# Patient Record
Sex: Male | Born: 1945 | Race: White | Hispanic: No | Marital: Married
Health system: Midwestern US, Academic
[De-identification: ages and names within clinical notes are randomized; demographics above are authoritative.]

---

## 2015-02-12 IMAGING — CT Head^_WITHOUT_CONTRAST (Adult)
1 series · 16 of 30 positions shown, 20 images · non-contrast
Comparison: March 28, 2006

CT HEAD WITHOUT CONTRAST
INDICATION: Headache
TECHNIQUE: Multiple standard axial reference plains without contrast
Orthogonal reconstructions

[Series 2: brain w/o 4.8 brain · axial · non-contrast · 0.55mm/px · z∈[+140,+289]mm · 16 of 32 slices shown, 20 images]
[im 2/32  brain]
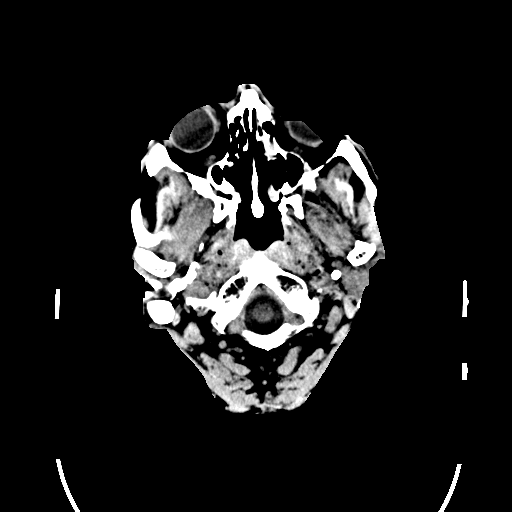
[im 2/32  bone]
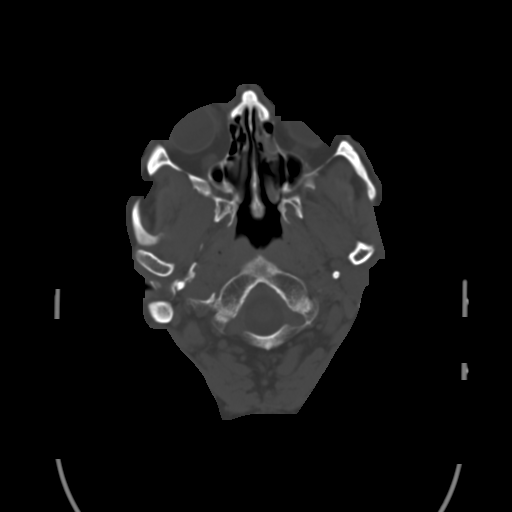
[im 4/32  brain]
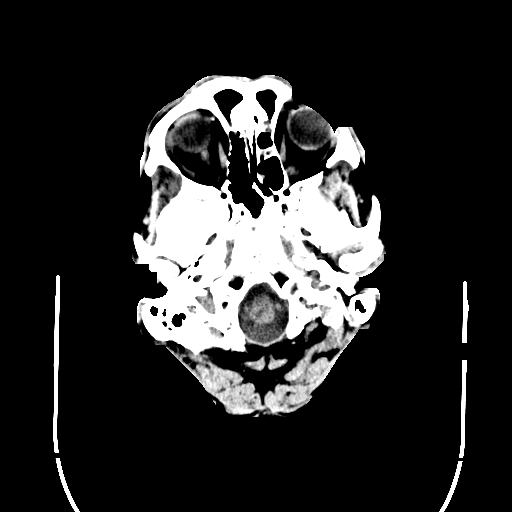
[im 6/32  brain]
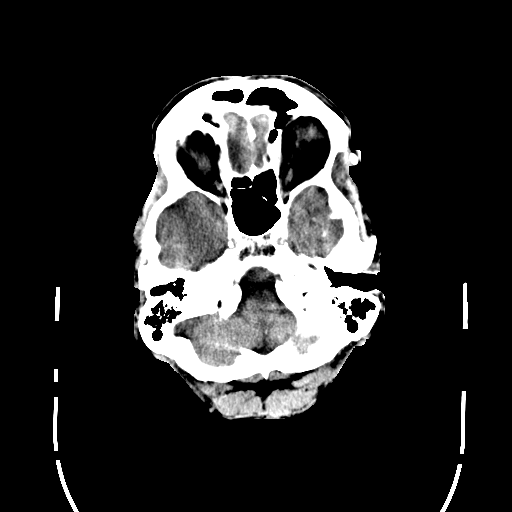
[im 8/32  brain]
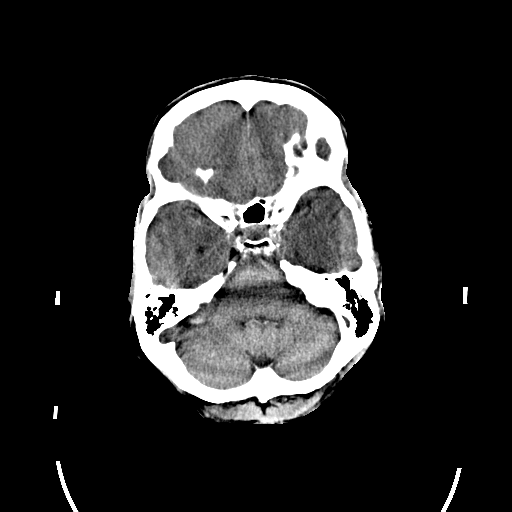
[im 9/32  brain]
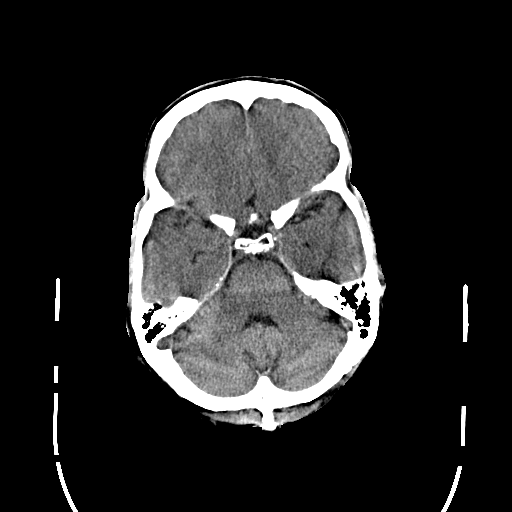
[im 9/32  bone]
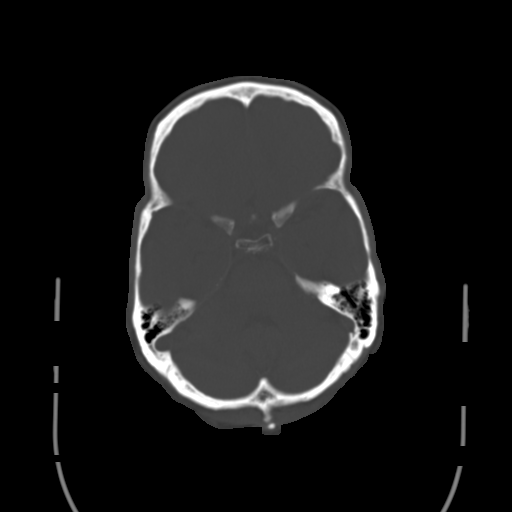
[im 11/32  brain]
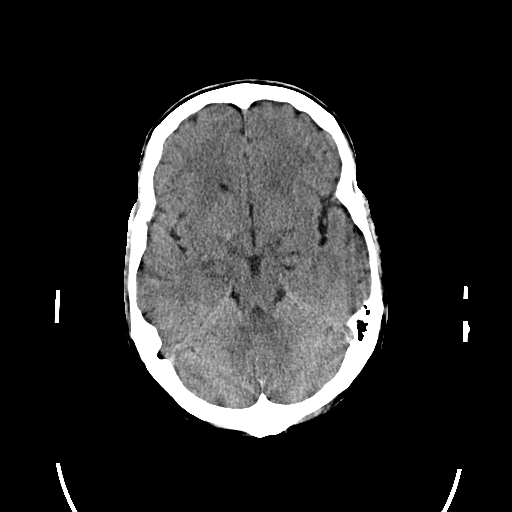
[im 13/32  brain]
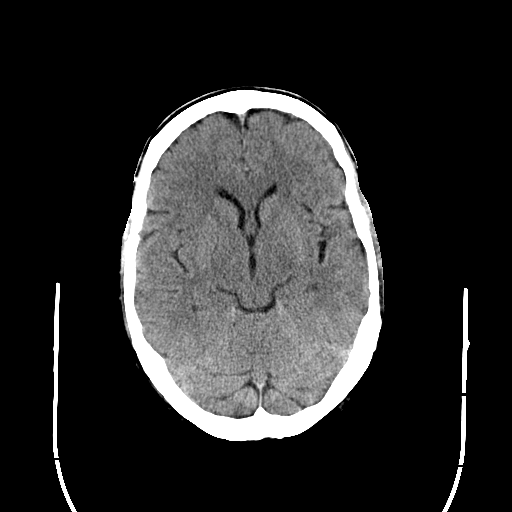
[im 15/32  brain]
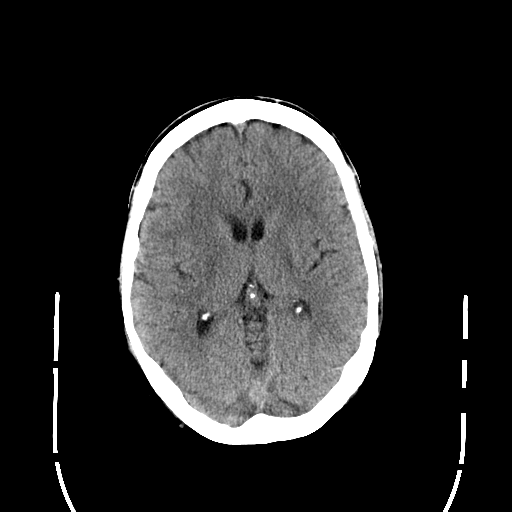
[im 17/32  brain]
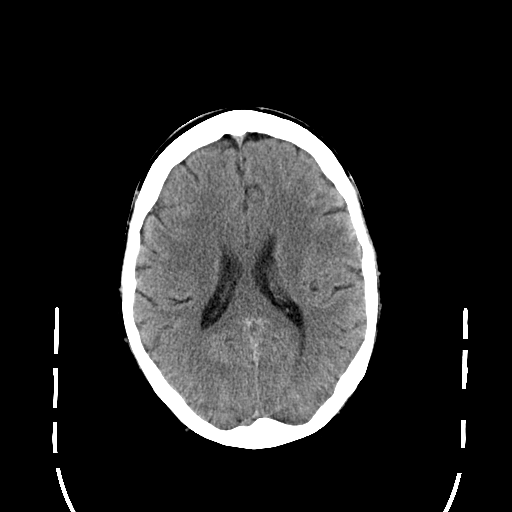
[im 17/32  bone]
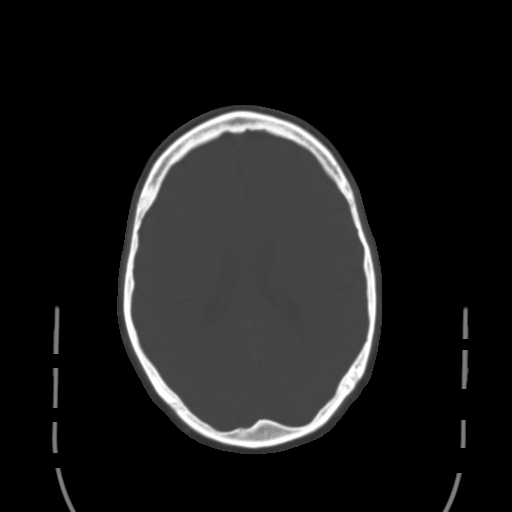
[im 19/32  brain]
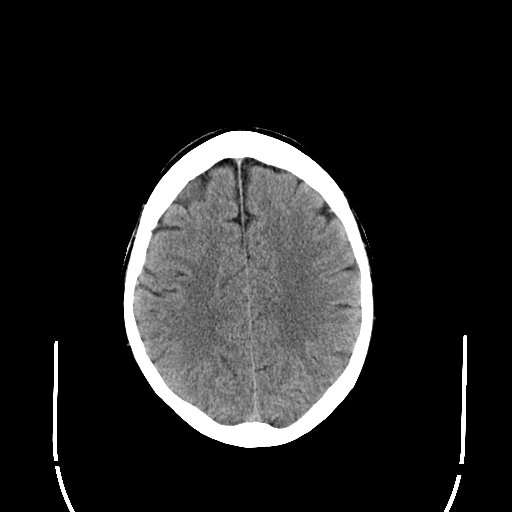
[im 21/32  brain]
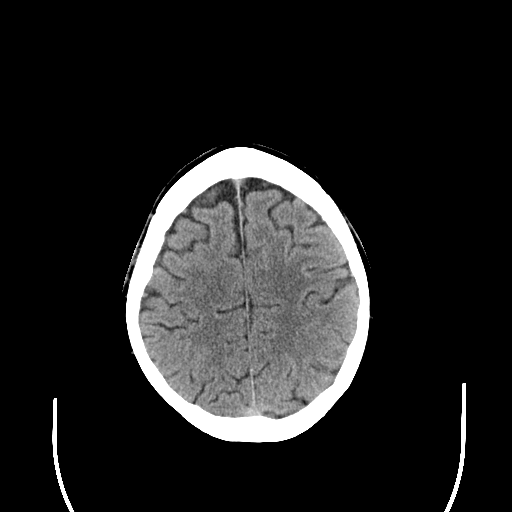
[im 23/32  brain]
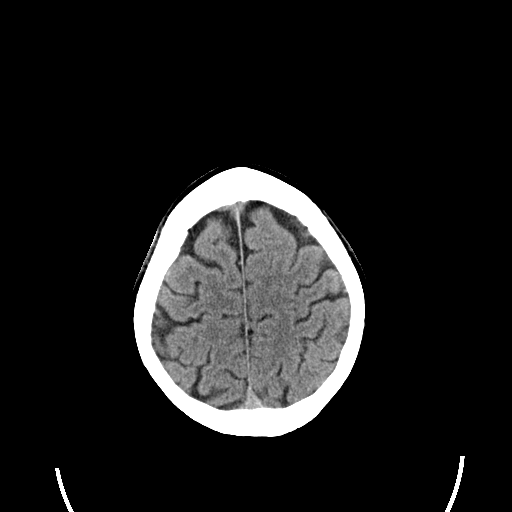
[im 24/32  brain]
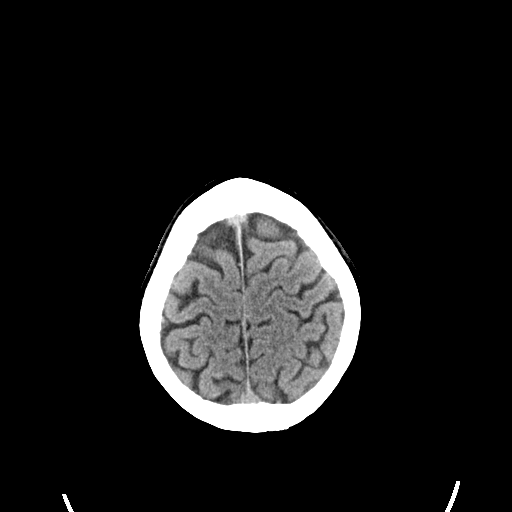
[im 24/32  bone]
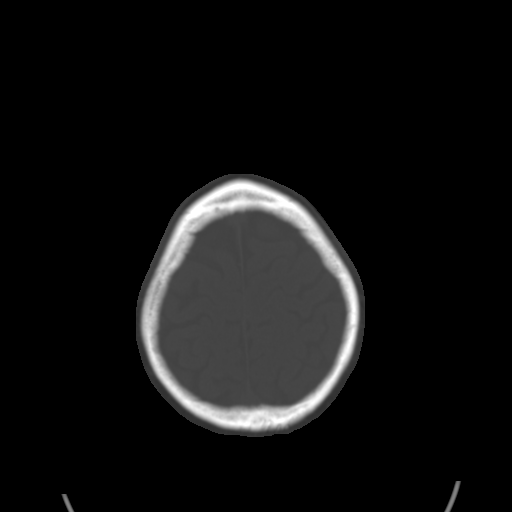
[im 26/32  brain]
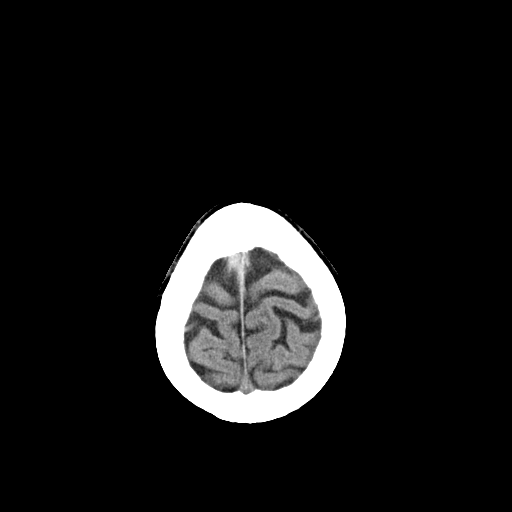
[im 28/32  brain]
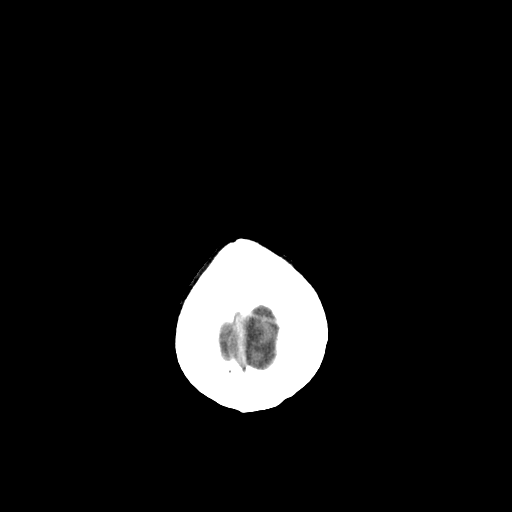
[im 30/32  brain]
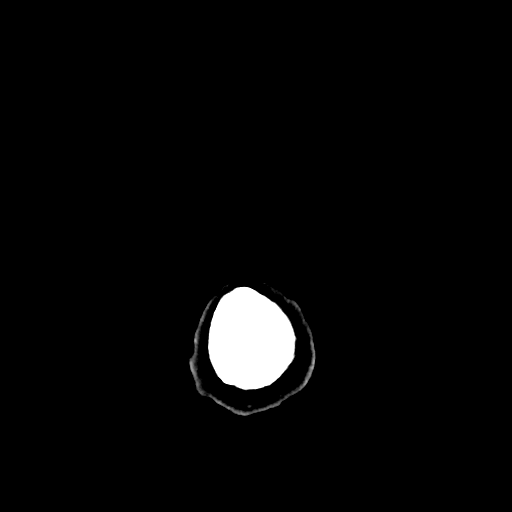

[16 of 30 positions shown; findings below may reference images not displayed]

IMPRESSION: No acute intracranial process
Exam discussed with Og Griswold PA at [DATE] p.m.
FINDINGS: The ventricular system, cortical sulci, sylvian fissures and basilar cisterns
are physiologic in size for the age. .
The is no hemorrhage, mass or focal defect.
The calvarium is symetrical  and intact
The demonstrated paranasal sinuses reveal moderate left ethmoid
mucoperiosteal thickening
The temporal bones reveal satisfactory aeration.

JF

## 2020-11-18 IMAGING — US AAASCREEN
1 series · 14 of 24 positions shown · non-contrast
Comparison: none

[Series 1: us aaa screening · 14 of 24 slices shown]
[im 1/24]
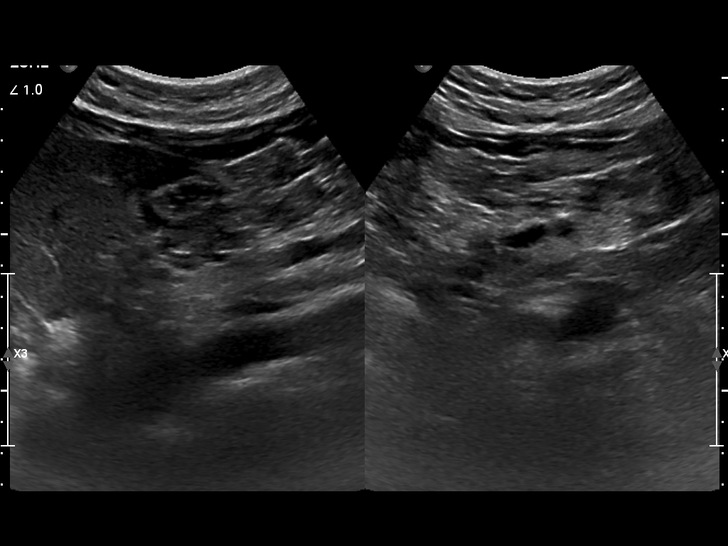
[im 3/24]
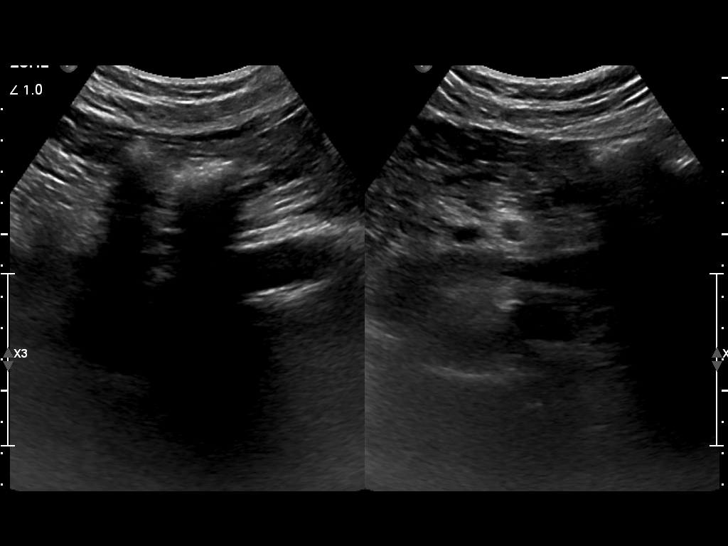
[im 5/24]
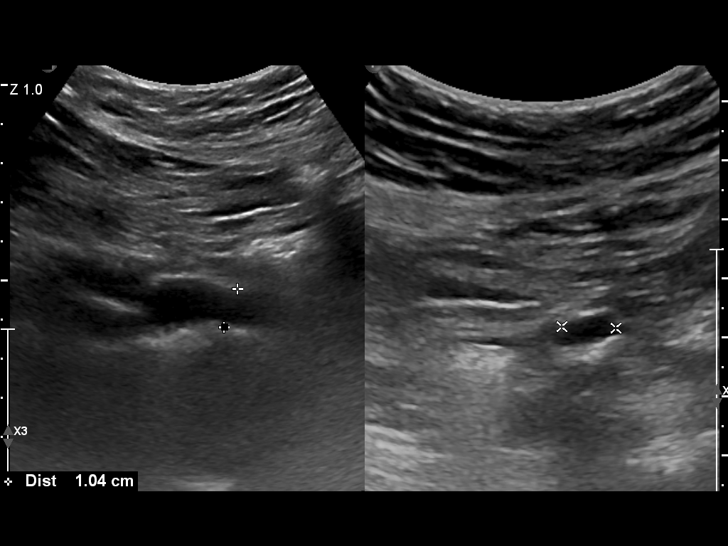
[im 7/24]
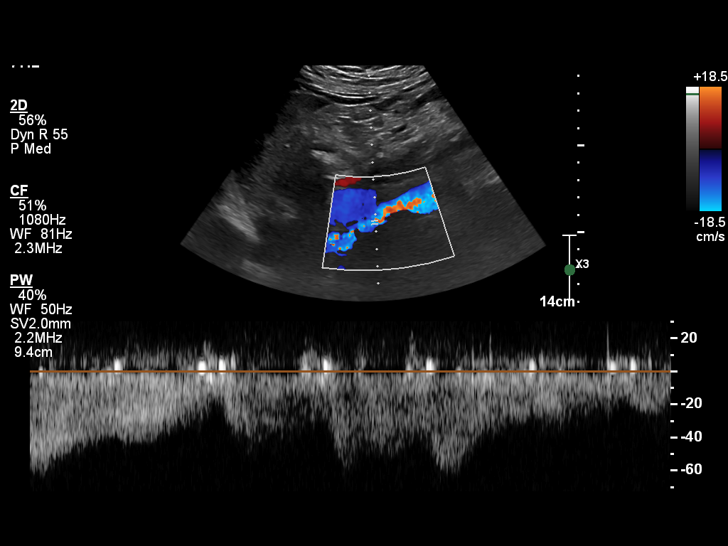
[im 8/24]
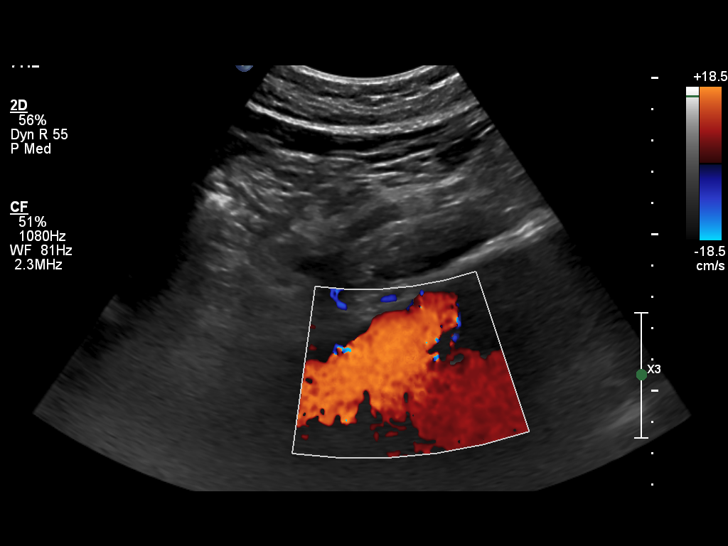
[im 10/24]
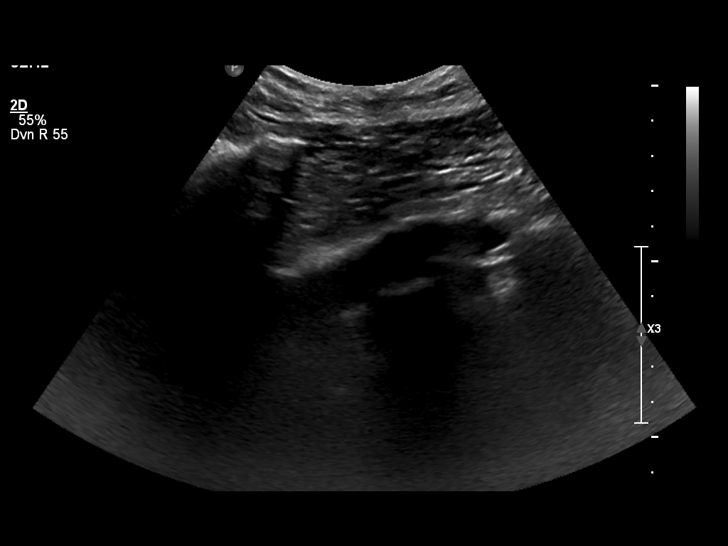
[im 12/24]
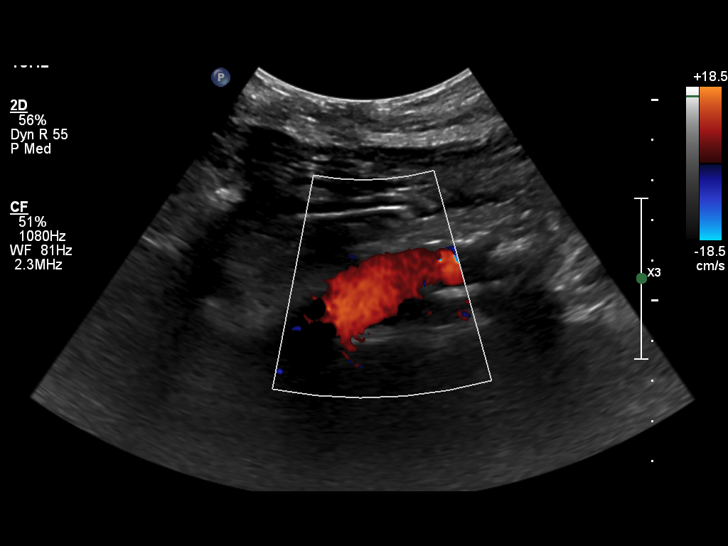
[im 13/24]
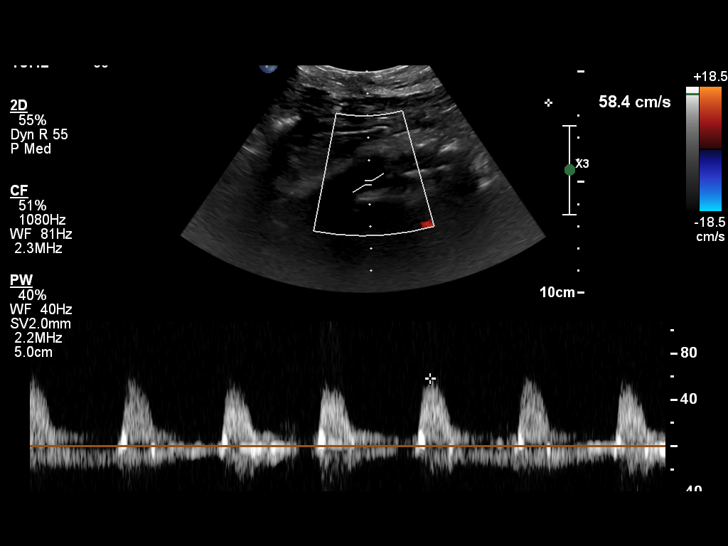
[im 15/24]
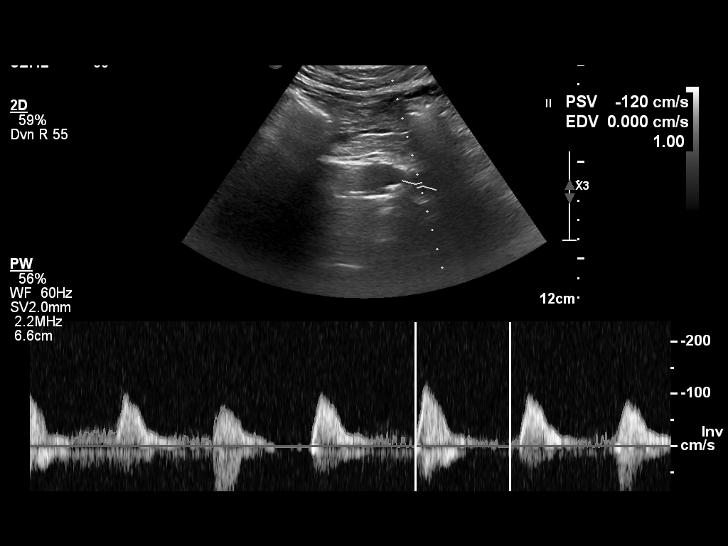
[im 17/24]
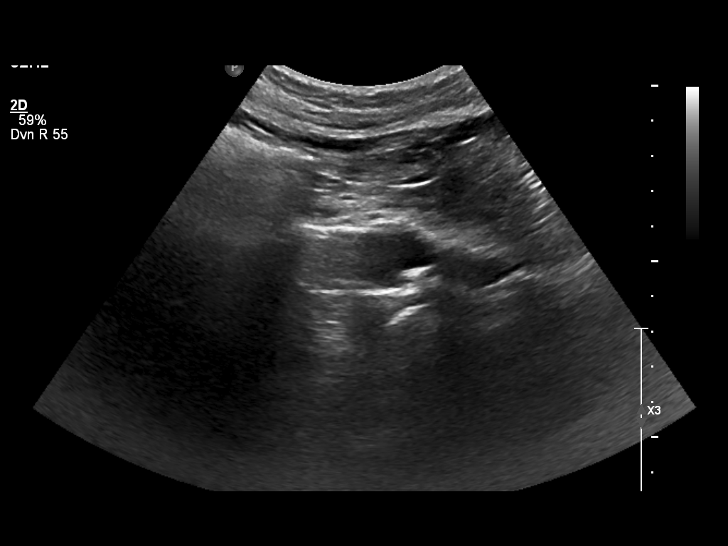
[im 19/24]
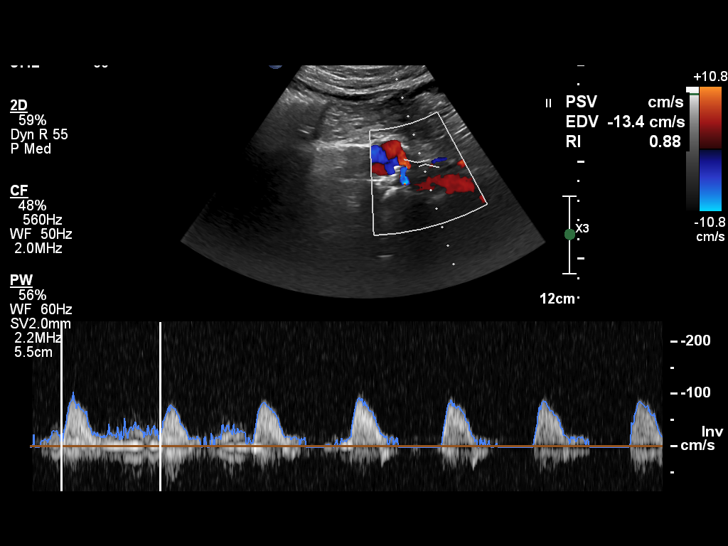
[im 20/24]
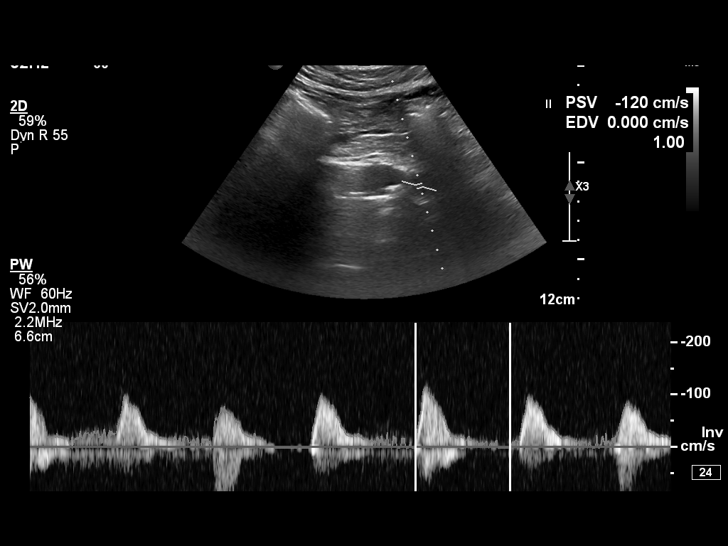
[im 22/24]
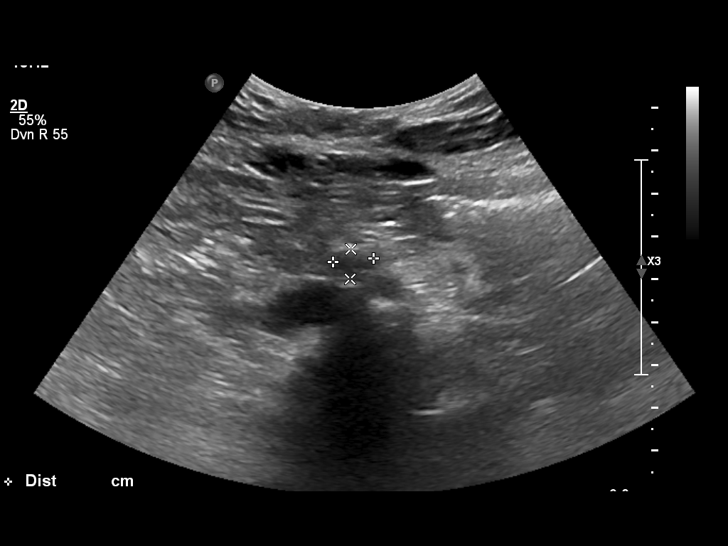
[im 24/24]
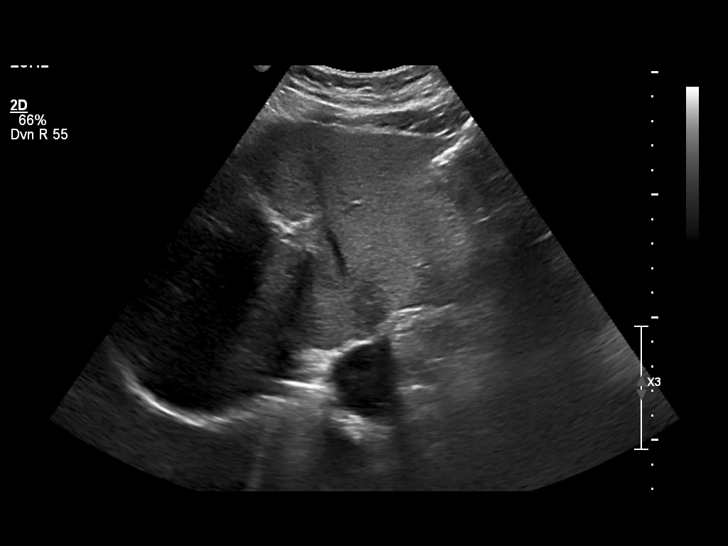

[14 of 24 positions shown; findings below may reference images not displayed]

DIAGNOSTIC STUDIES

EXAM

Abdominal aortic ultrasound

INDICATION

screening for AAA
AAA SCREENING

TECHNIQUE

Standard abdominal aortic ultrasound was obtained.

COMPARISONS

None available

FINDINGS

There is no evidence for aneurysmal enlargement of the abdominal aorta. Scattered plaque formation
is noted. Common iliac arteries are normal in caliber.

IMPRESSION

No evidence for abdominal aortic aneurysm.

Tech Notes:

AAA SCREENING

## 2020-11-18 IMAGING — CT LOW_DOSE_LUNG(Adult)
2 of 9 series · 11 of 36 positions shown, 14 images · non-contrast
Comparison: none

[Series 10: lung cor 1.00 br44 s3 · coronal · 0.73mm/px · 1 of 298 slices shown]
[im 149/298  lung]
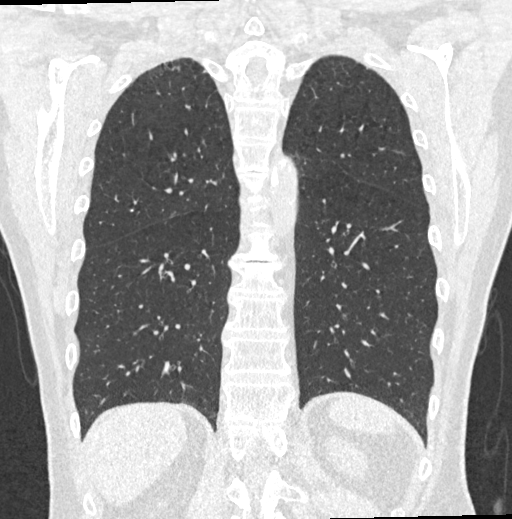

[Series 17: lung 1.00 br60 s3 cad · axial · 0.70mm/px · z∈[+1557,+1865]mm · 10 of 538 slices shown, 13 images]
[im 49/538  mediastinal]
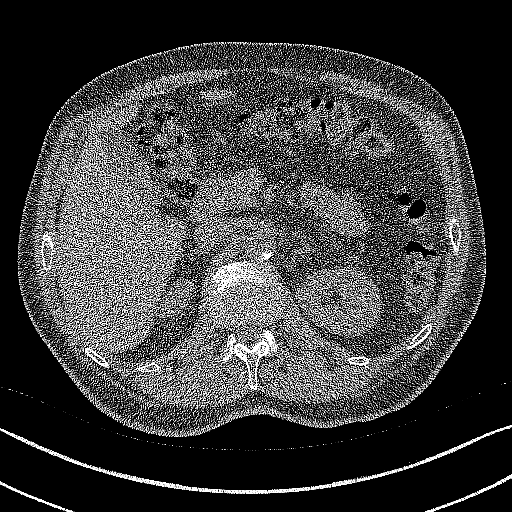
[im 49/538  lung]
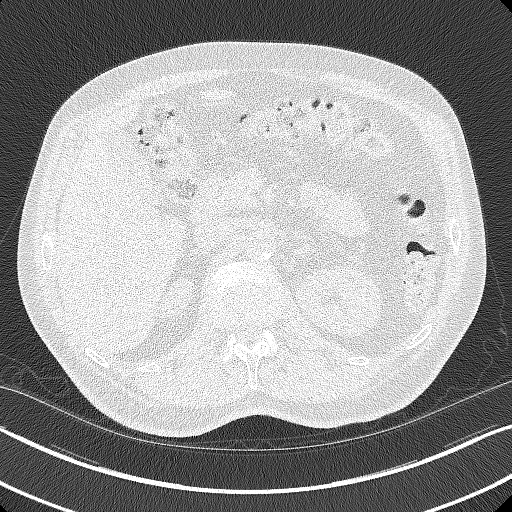
[im 98/538  lung]
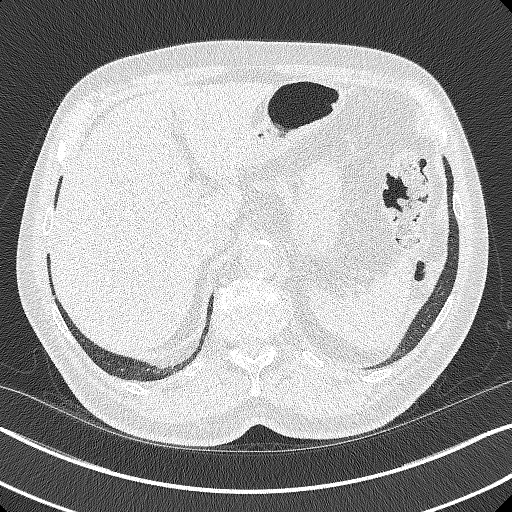
[im 147/538  lung]
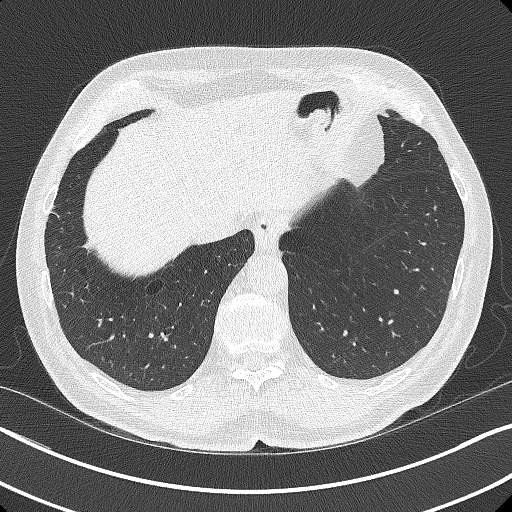
[im 196/538  lung]
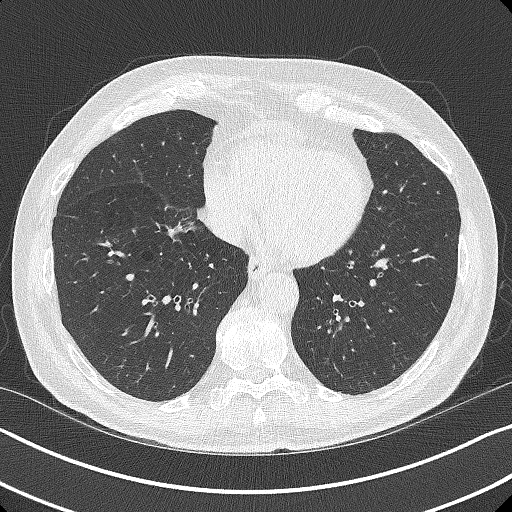
[im 245/538  mediastinal]
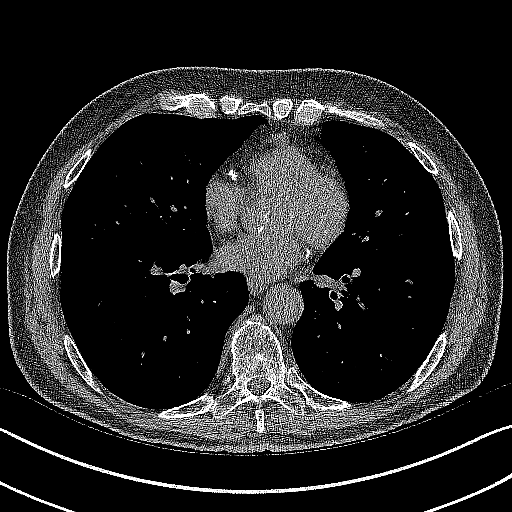
[im 245/538  lung]
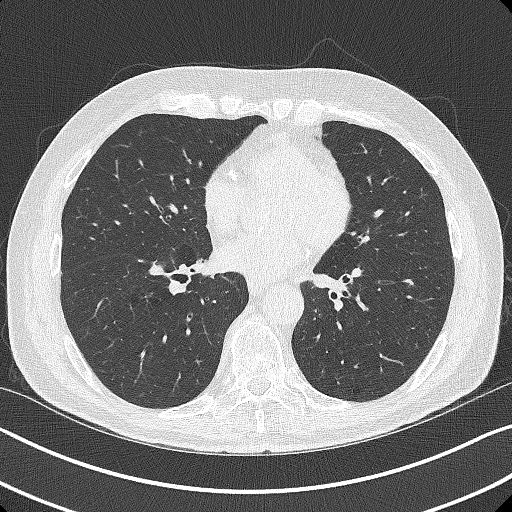
[im 293/538  lung]
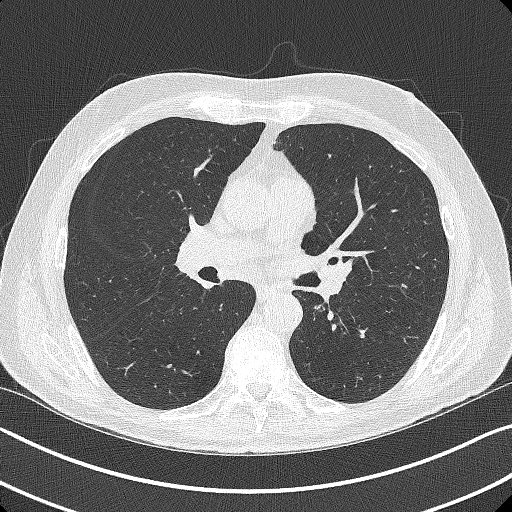
[im 342/538  lung]
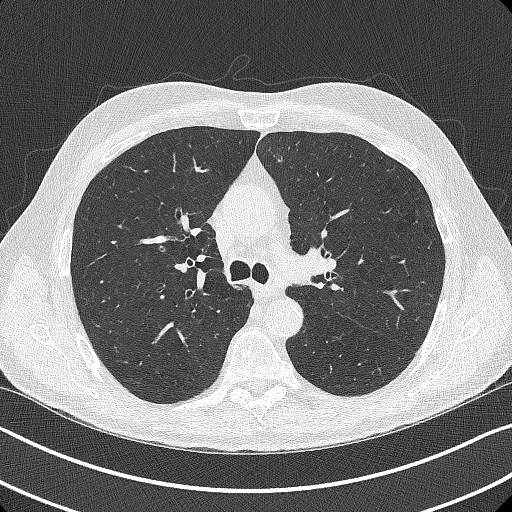
[im 391/538  lung]
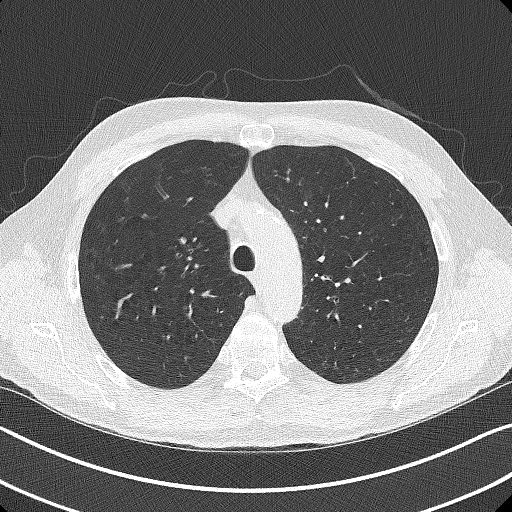
[im 440/538  mediastinal]
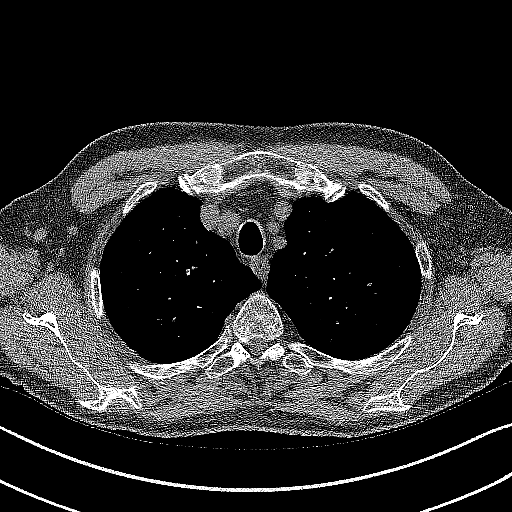
[im 440/538  lung]
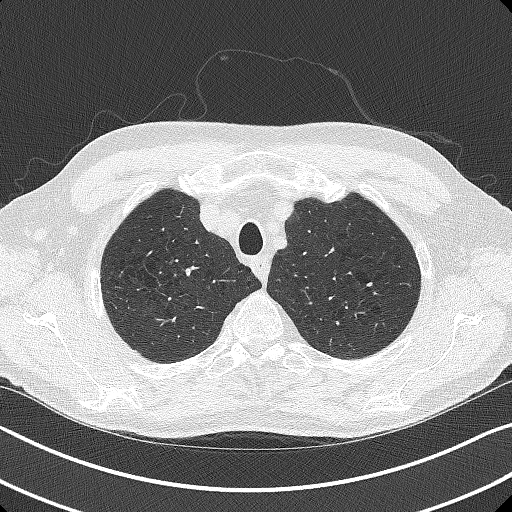
[im 489/538  lung]
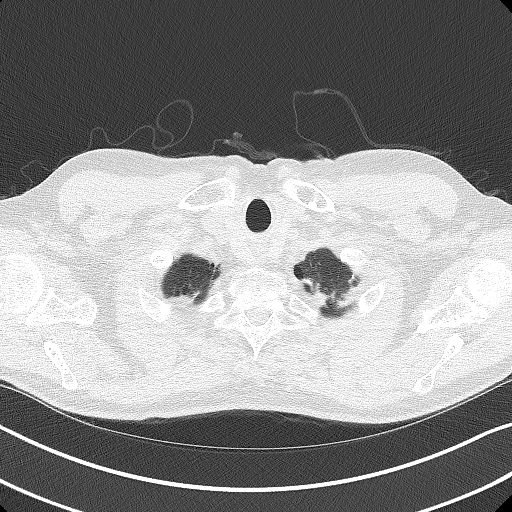

[11 of 36 positions shown; findings below may reference images not displayed]

DIAGNOSTIC STUDIES

EXAM

CT low-dose lung screening.

INDICATION

current every day smoker
F/U LUNG CANCER SCREENING. 1 PACK PER DAY X 60 YEARS. CT/NM 0/0. TJ/KF

TECHNIQUE

All CT scans at this facility use dose modulation, iterative reconstruction, and/or weight based
dosing when appropriate to reduce radiation dose to as low as reasonably achievable.

Number of previous computed tomography exams in the last 12 months is 0  .

Number of previous nuclear medicine myocardial perfusion studies in the last 12 months is 0  .

COMPARISONS

October 15, 2019

FINDINGS

Low-dose technique is limited in evaluation of mediastinal and subdiaphragmatic structures. Heart
size is normal. There is dense calcification of the coronary vessels.

Emphysematous changes throughout the lungs are re-demonstrated with unchanged right apical pleural
thickening and scarring image 45 series 2. Bronchial wall thickening and bronchiectasis are noted
which may indicate acute or chronic bronchitis. Few calcified granulomas are noted. No concerning
pulmonary nodules are evident. No concerning osseous lesions are seen.

IMPRESSION

Stable low-dose lung screening. Lung rads 1. Recommend repeat exam in 1 year.

Tech Notes:

F/U LUNG CANCER SCREENING. 1 PACK PER DAY X 60 YEARS. CT/NM 0/0. TJ/KF

## 2021-05-21 IMAGING — CT BRAIN WO(Adult)
3 of 4 series · 14 of 47 positions shown, 16 images · non-contrast
Comparison: none

[Series 4: brain cor 5.00 hr40 s3 · coronal · 0.34mm/px · 3 of 42 slices shown]
[im 14/42  brain]
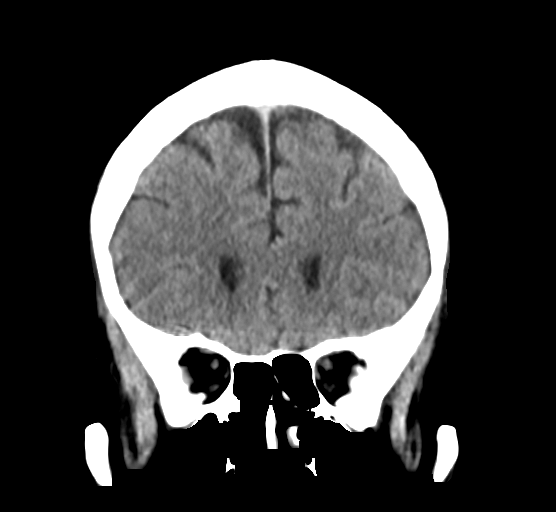
[im 19/42  brain]
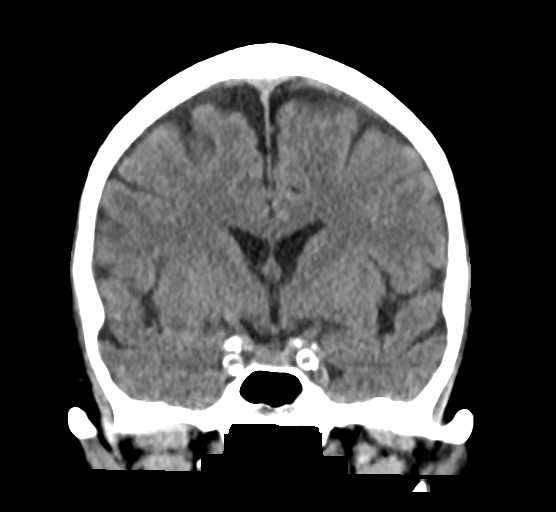
[im 23/42  brain]
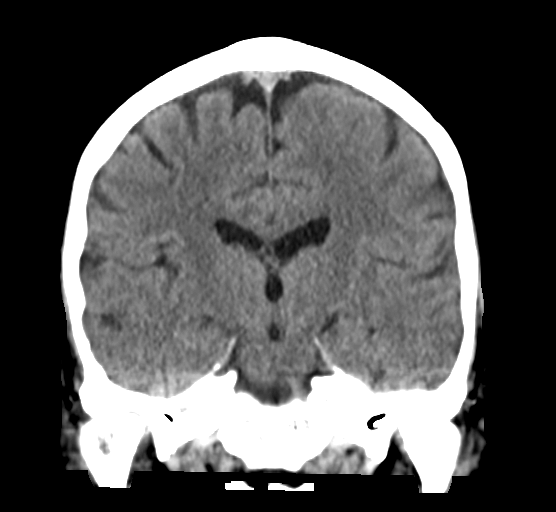

[Series 6: brain sag 5.00 hr40 s3 · sagittal · 0.34mm/px · 3 of 37 slices shown]
[im 13/37  brain]
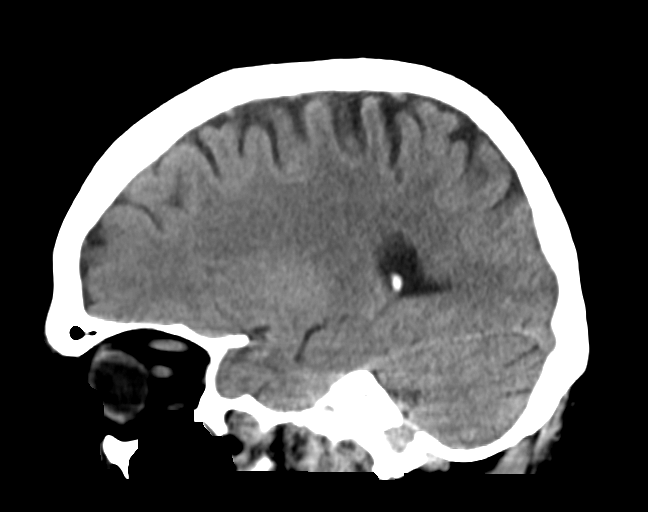
[im 19/37  brain]
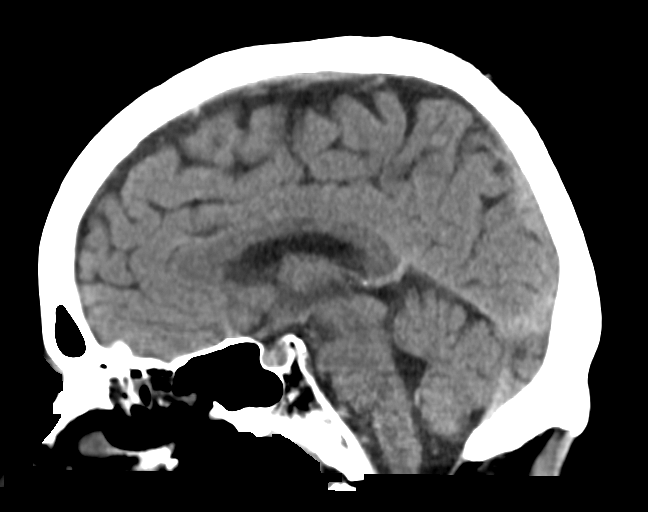
[im 25/37  brain]
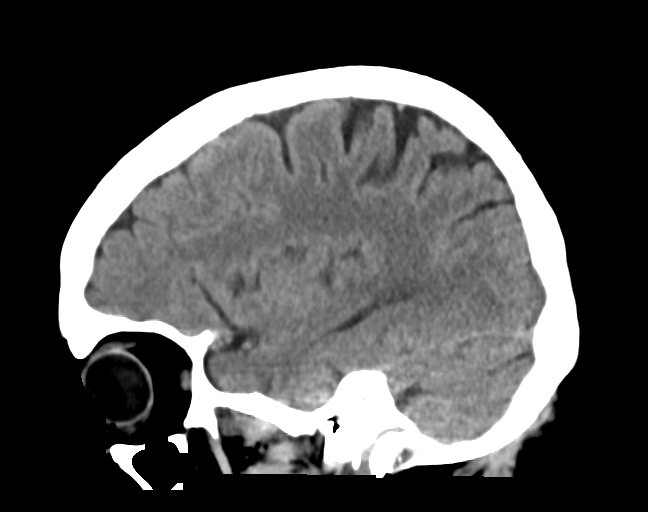

[Series 8: brain ax 2.00 hr60 s3 · axial · 0.37mm/px · z∈[-528,-392]mm · 8 of 85 slices shown, 10 images]
[im 9/85  brain]
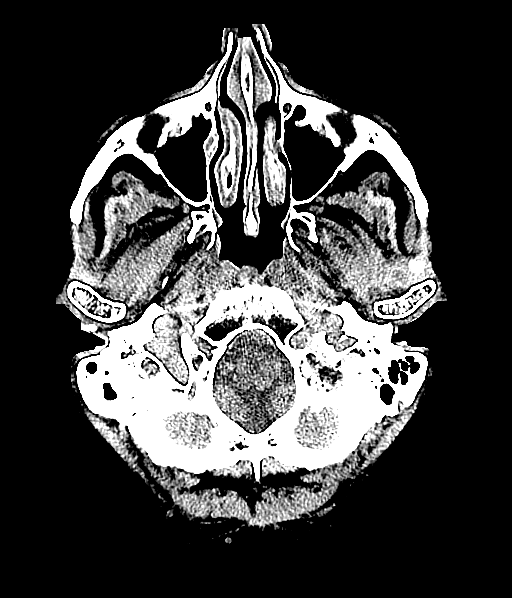
[im 9/85  bone]
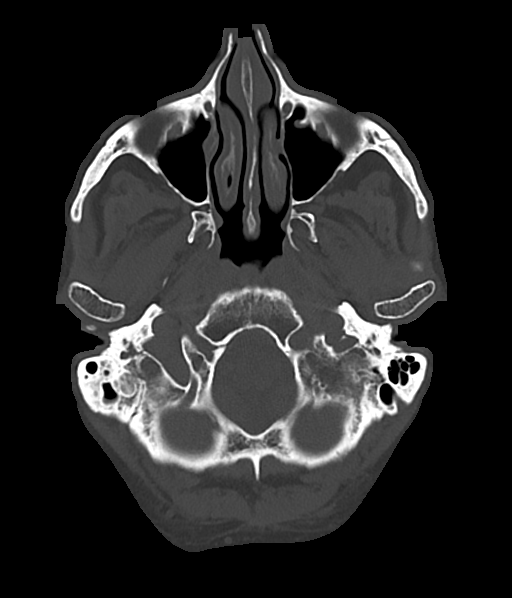
[im 17/85  brain]
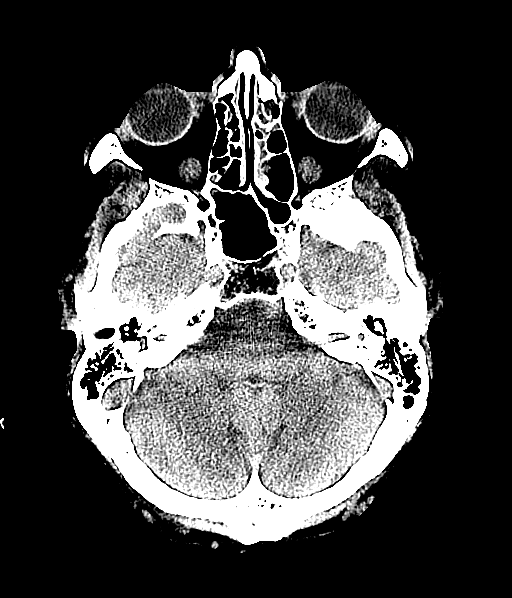
[im 26/85  brain]
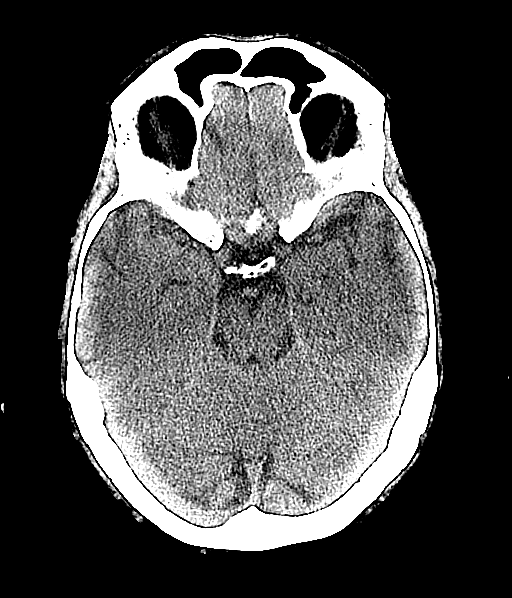
[im 38/85  brain]
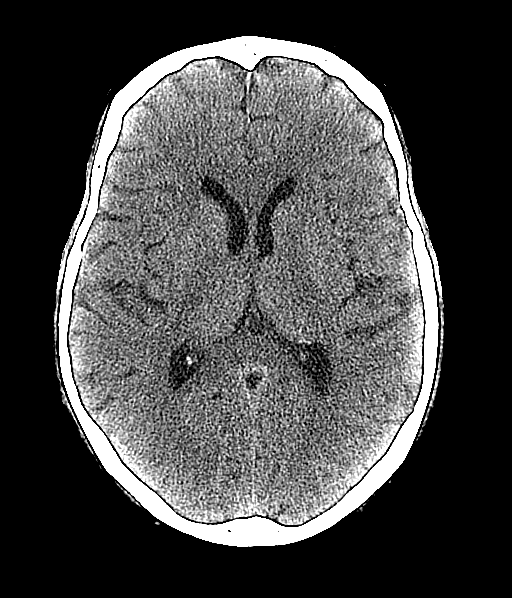
[im 47/85  brain]
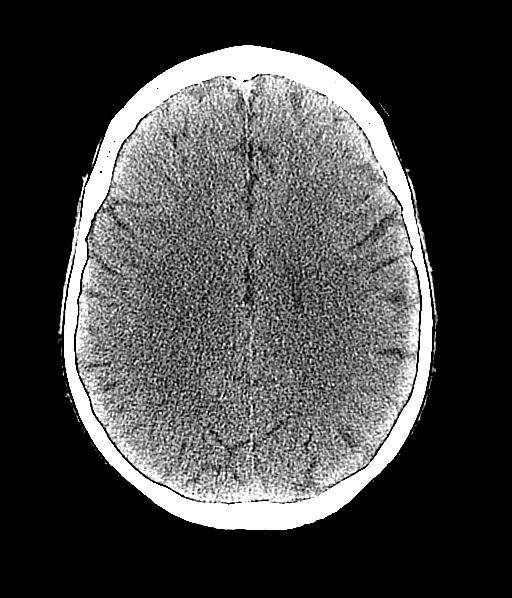
[im 47/85  bone]
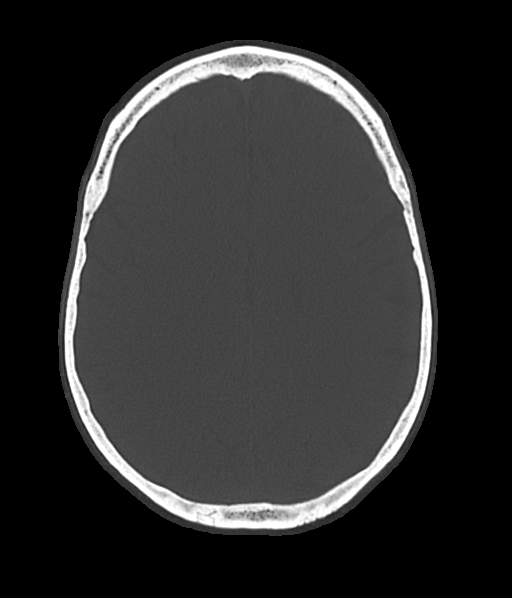
[im 59/85  brain]
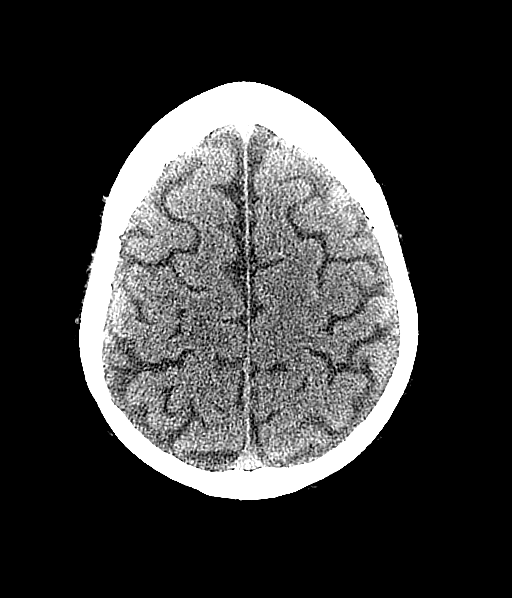
[im 68/85  brain]
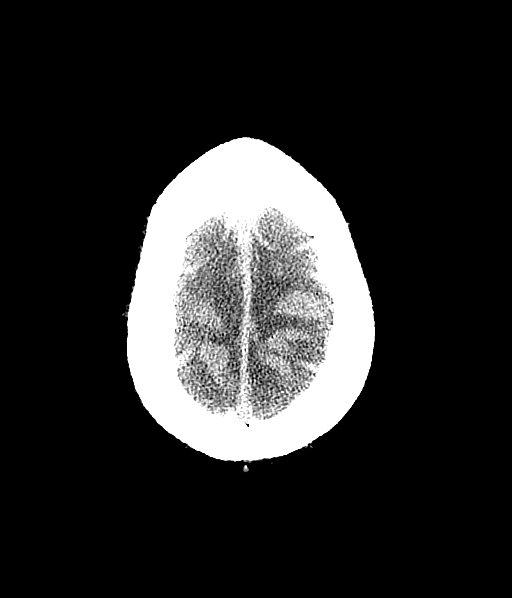
[im 76/85  brain]
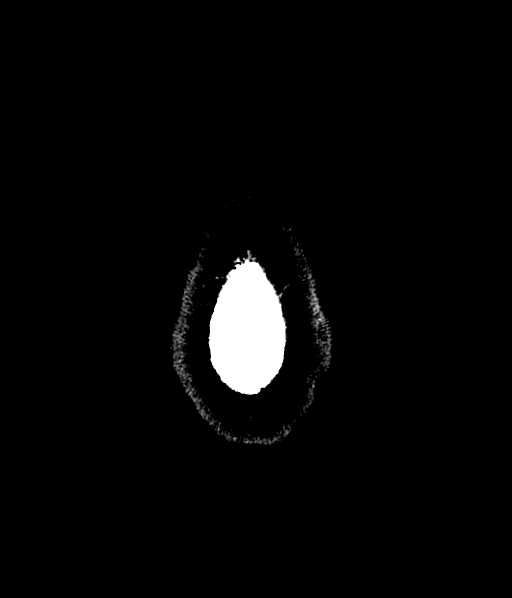

[14 of 47 positions shown; findings below may reference images not displayed]

EXAM

CT HEAD

INDICATION

blurred vision
Blurred vision earlier. CT/NM 1/0. TB

TECHNIQUE

All CT scans at this facility use dose modulation, iterative reconstruction, and/or weight based
dosing when appropriate to reduce radiation dose to as low as reasonably achievable.

CT of the head without contrast was performed. # of CT scans in the past year: 1 # of Myocardial
perfusion scans this past year: 0

COMPARISONS

None available at the time of dictation

FINDINGS

Parenchyma: No acute hemorrhage. There is no mass effect, midline shift, or herniation. There is
preservation of the gray white differentiation.

Ventricles / Extra-axial spaces: There is no hydrocephalus. There are no extra-axial fluid
collections.

Other: The bony structures are intact. Visualized portions of the paranasal sinuses and mastoid air
cells are clear. Vascular calcifications.

IMPRESSION
1. [No CT evidence of an acute intracranial abnormality.]

Tech Notes:

Blurred vision earlier. CT/NM 1/0. TB

## 2021-05-21 IMAGING — CR [ID]
1 series · 1 of 1 positions shown · non-contrast
Comparison: none

[chest pa]
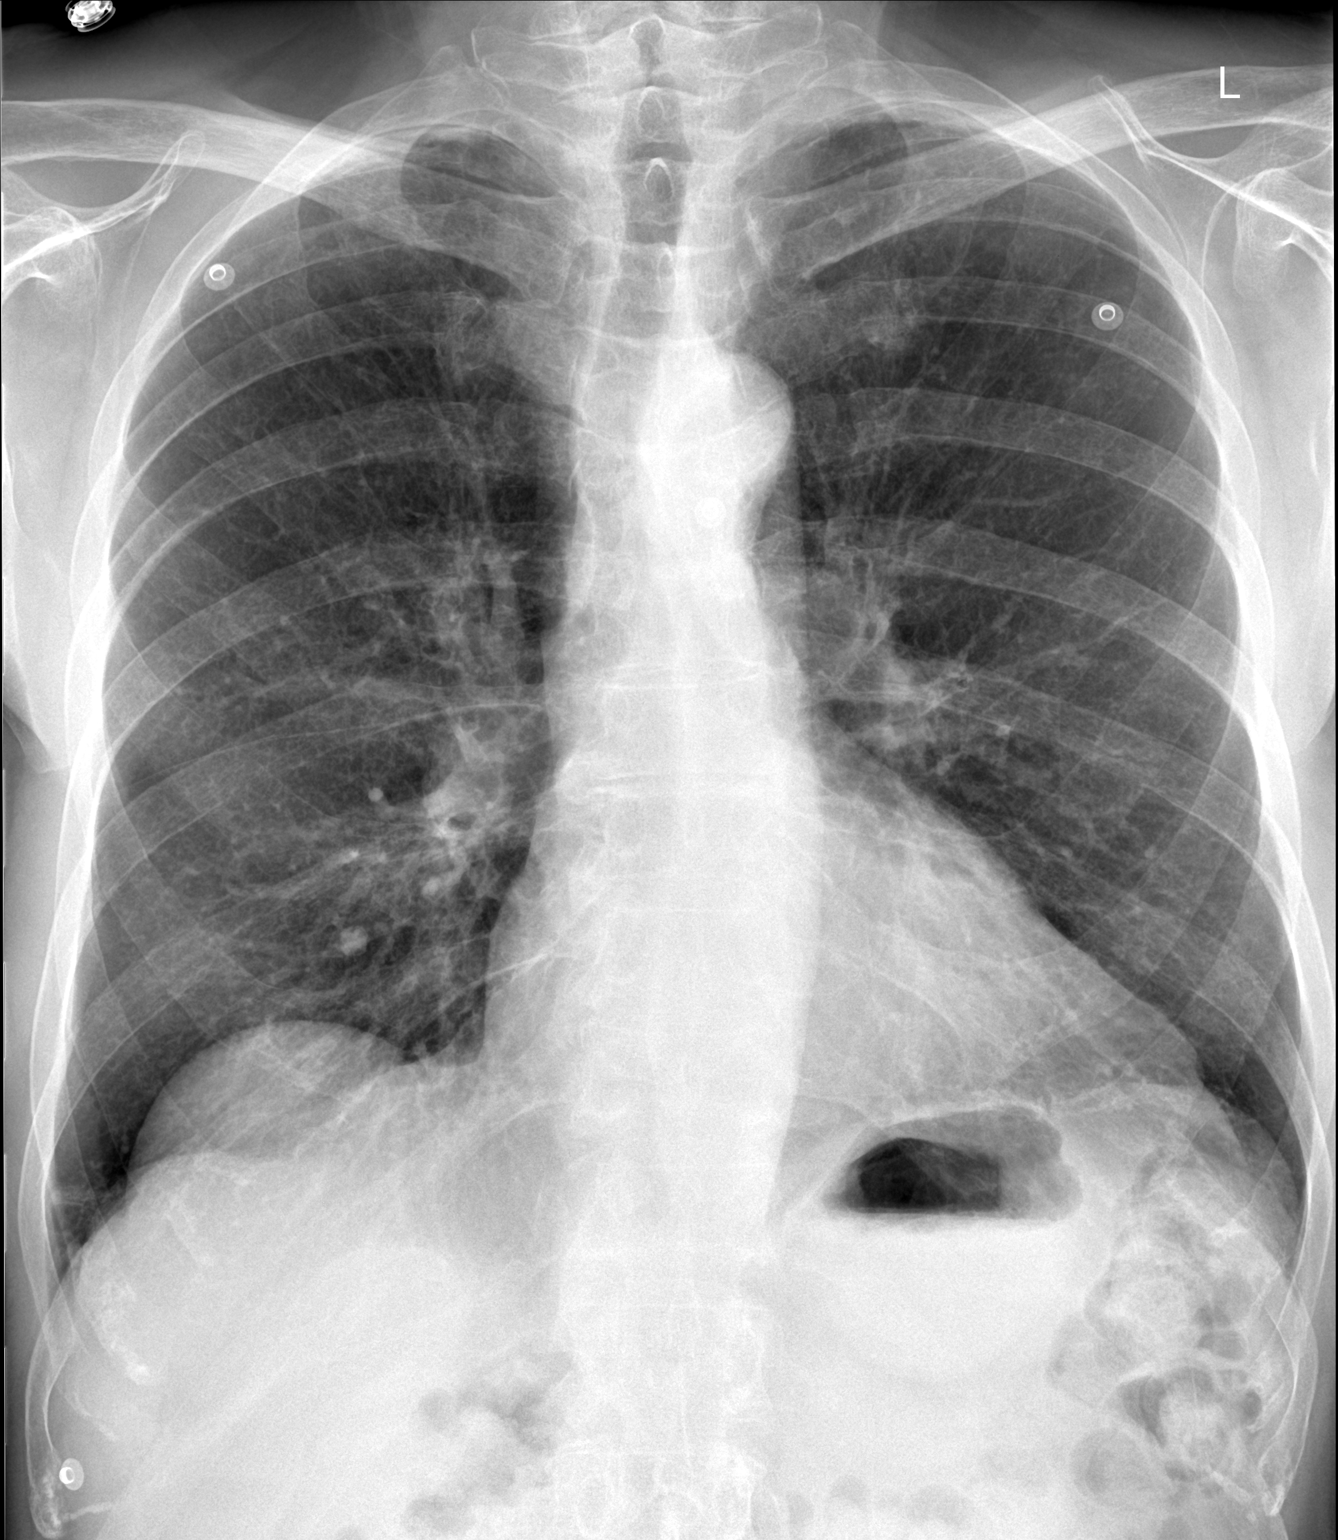

[1 of 1 positions shown; findings below may reference images not displayed]

EXAM

XR chest 1V

INDICATION

SOB- chronic cough
Shortness of breath when he lays down and chronic cough. Patient states that he is a current
smoker. TB

TECHNIQUE

Single view of the chest

COMPARISONS

None available at the time of dictation.

FINDINGS

No radiographically apparent pleural effusion, consolidation, or pneumothorax. Calcified
granulomas.

The cardiomediastinal silhouette is  normal in size.

The osseous structures are without an acute osseous abnormality.

IMPRESSION
1. No radiographic evidence of an acute cardiopulmonary process.

Tech Notes:

Shortness of breath when he lays down and chronic cough. Patient states that he is a current smoker.
TB

## 2021-05-22 ENCOUNTER — Encounter: Admit: 2021-05-22 | Discharge: 2021-05-22 | Payer: MEDICARE

## 2021-05-22 IMAGING — US CARDUPBI
1 series · 14 of 16 positions shown · non-contrast
Comparison: none

[Series 1: us carotid duplex bi · 14 of 69 slices shown]
[im 1/69]
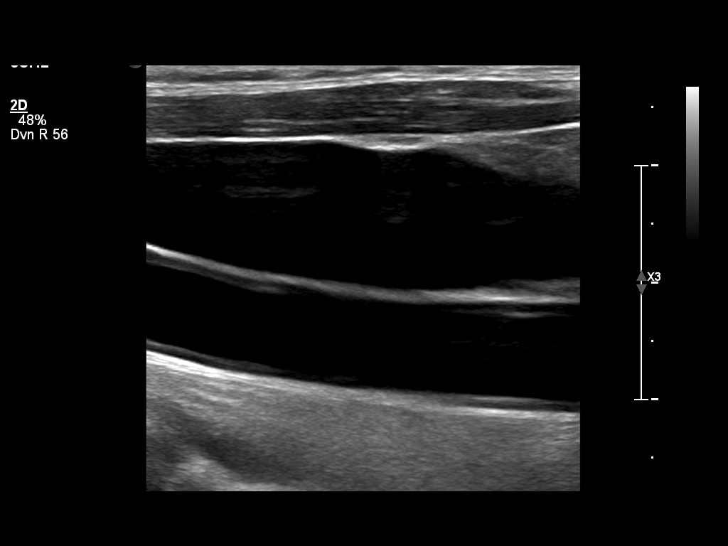
[im 5/69]
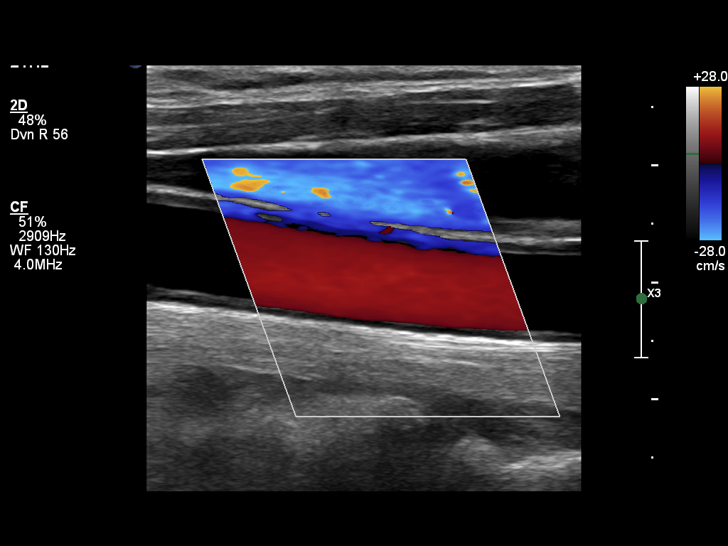
[im 10/69]
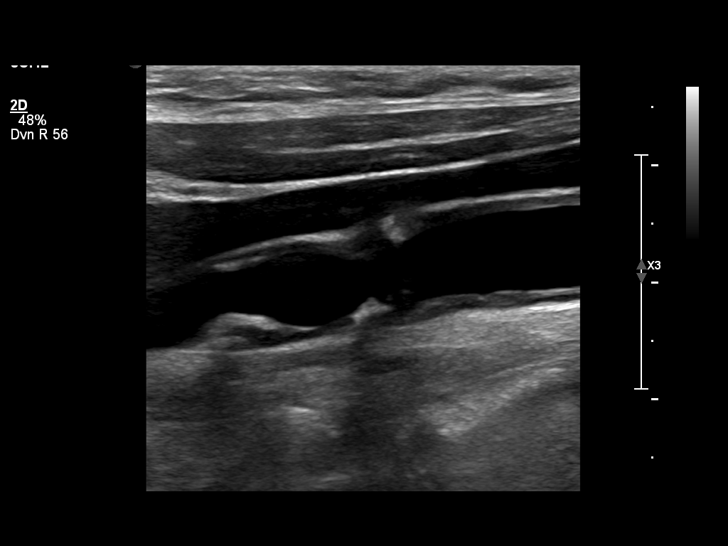
[im 19/69]
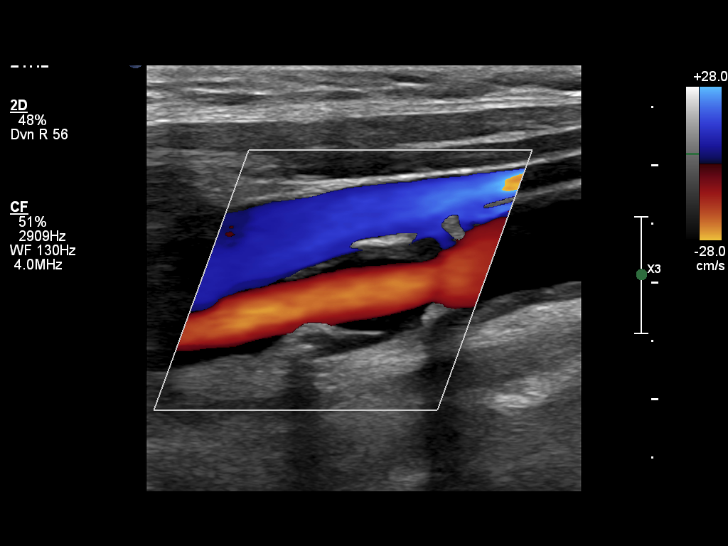
[im 23/69]
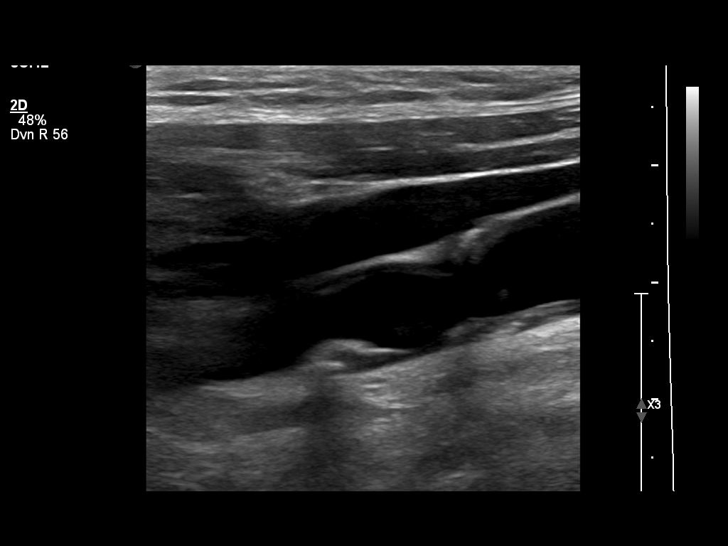
[im 28/69]
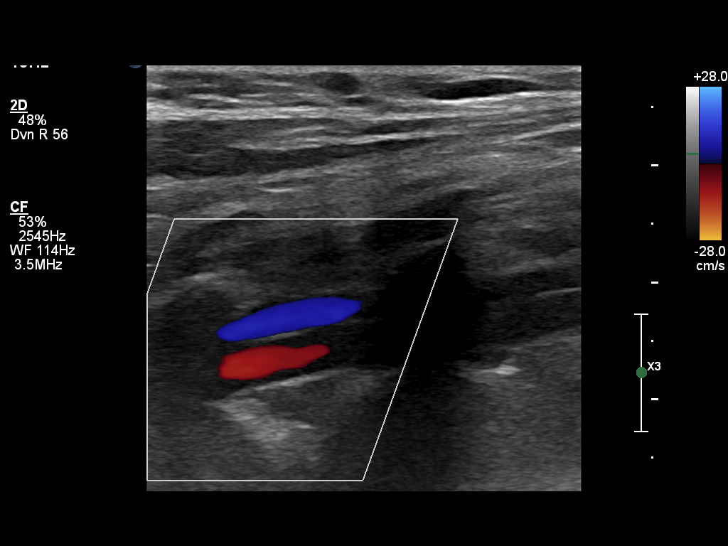
[im 32/69]
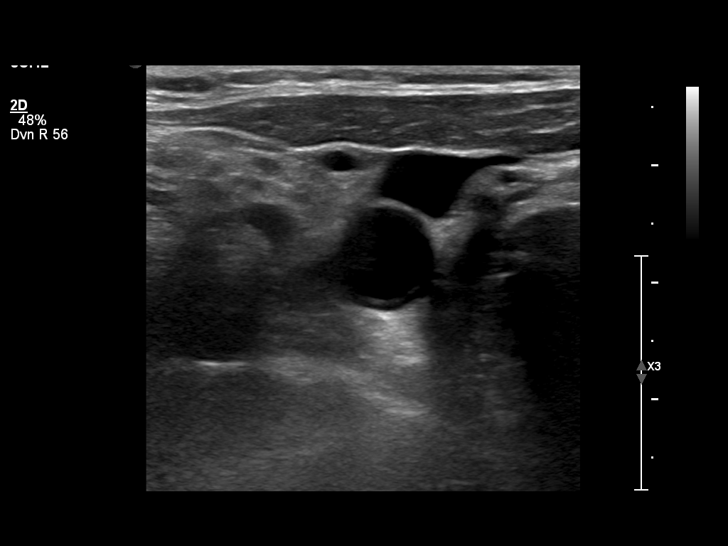
[im 37/69]
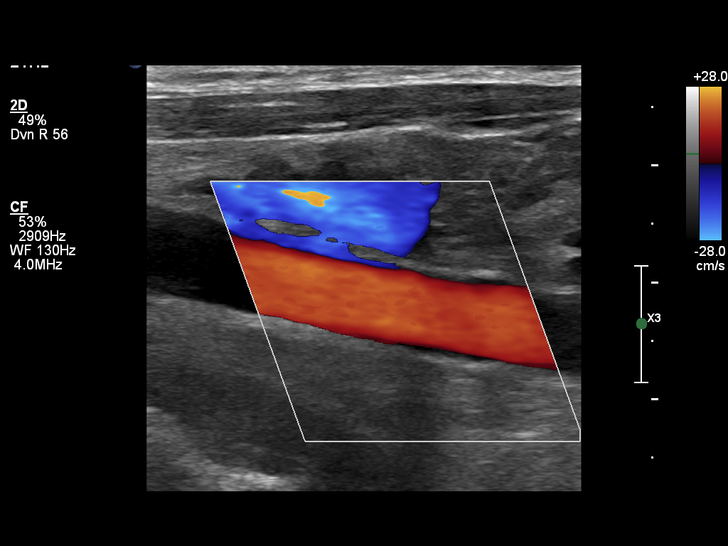
[im 41/69]
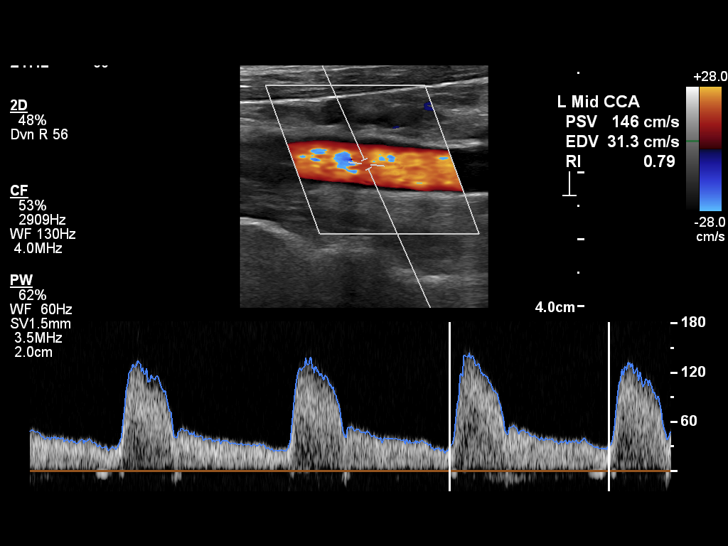
[im 46/69]
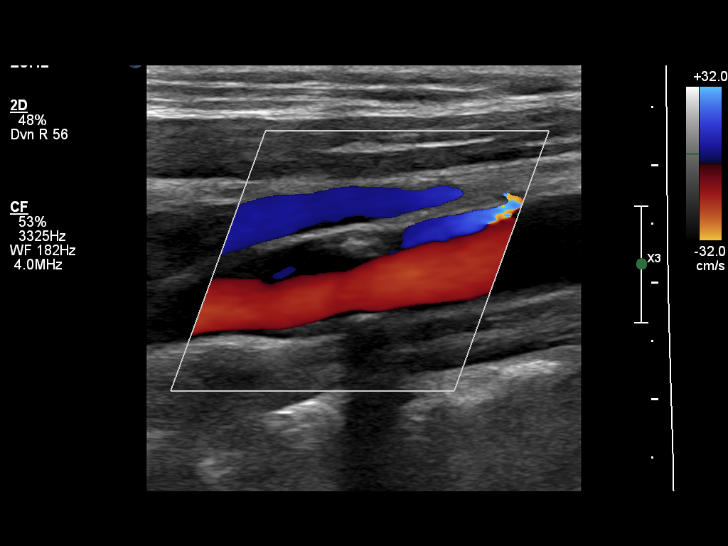
[im 55/69]
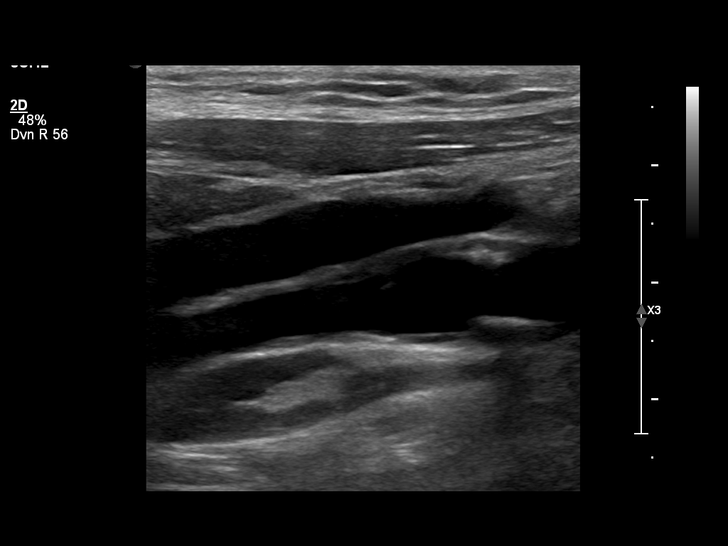
[im 59/69]
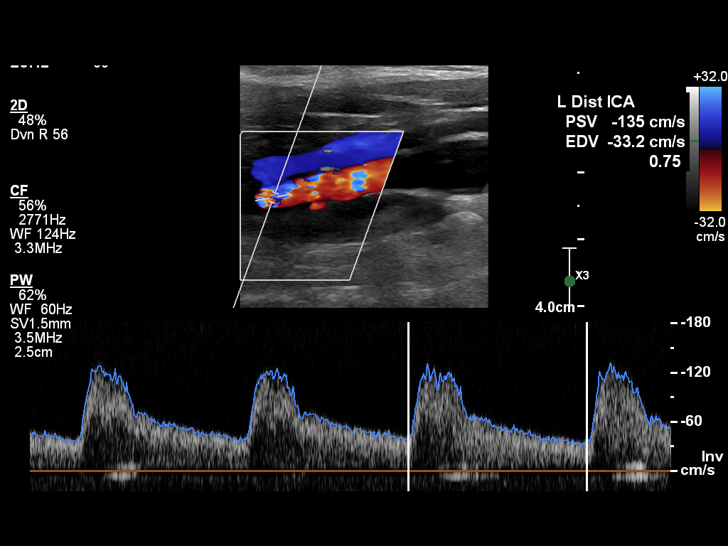
[im 64/69]
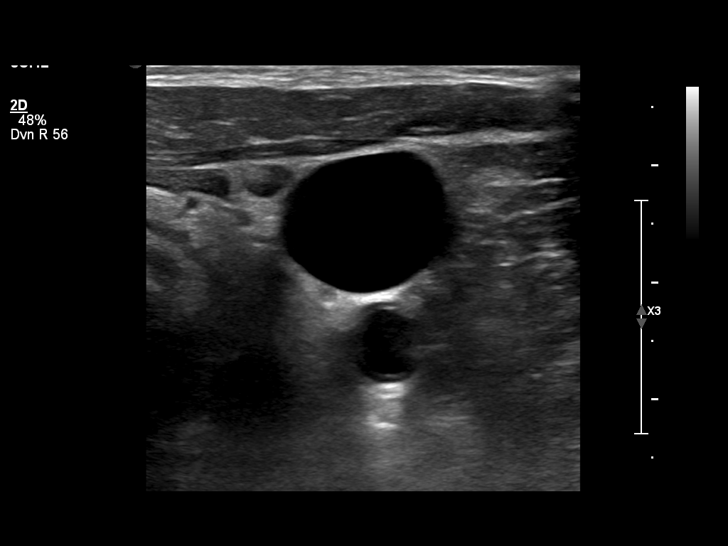
[im 69/69]
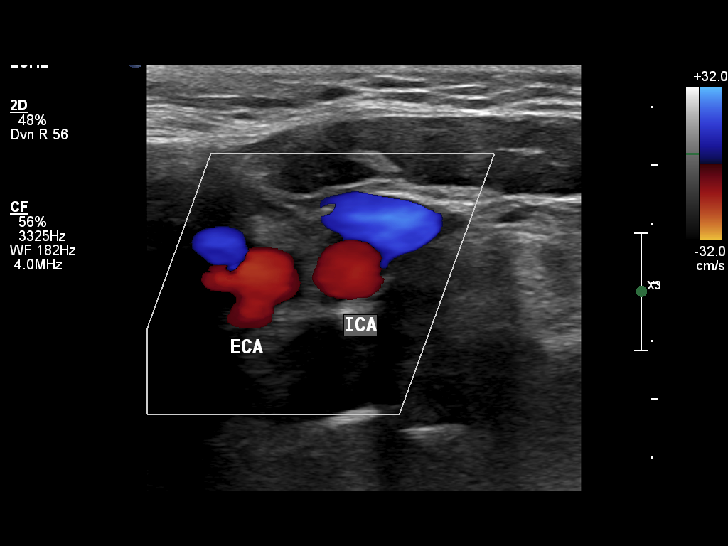

[14 of 16 positions shown; findings below may reference images not displayed]

DIAGNOSTIC STUDIES

EXAM

DUPLEX SCAN OF EXTRACRANIAL ARTERIES; COMPLETE BILATERAL STUDY, CPT 28443

INDICATION

Visual changes.

TECHNIQUE

NASCET criteria were utilized.

COMPARISONS

No priors available for comparison.

FINDINGS

Elevated velocity within the right internal carotid artery, 175 cm/second. No significant velocity
elevation within the left internal carotid artery. Antegrade flow within the vertebral arteries.

IMPRESSION

1. There is a 50-69 percent stenosis within the prox right internal carotid artery.

Tech Notes:

vision changes; hx of htn

## 2021-05-22 IMAGING — US ECHOCOMPL
1 series · 13 of 24 positions shown · non-contrast
Comparison: none

[Series 1: us echo 2d, complete · 86 acquisitions, 13 frames shown]
[im 1/86]
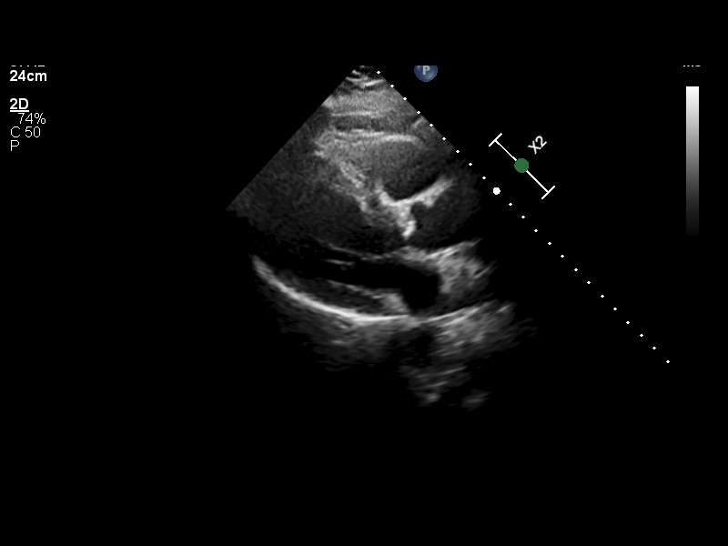
[im 8/86]
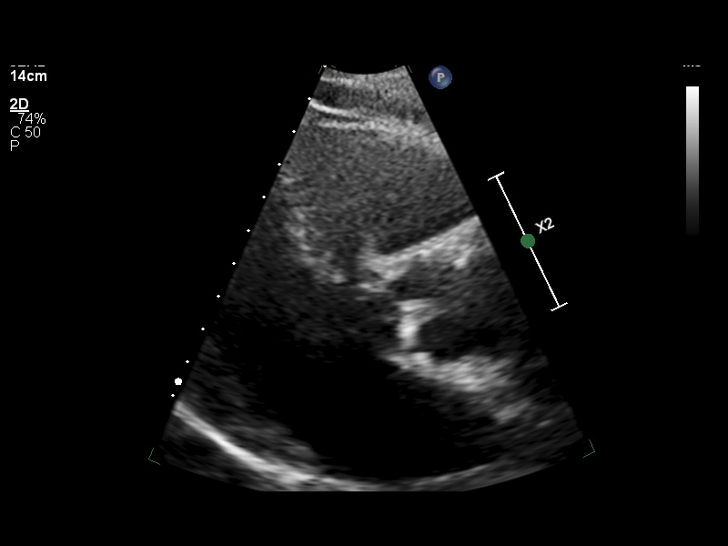
[im 15/86]
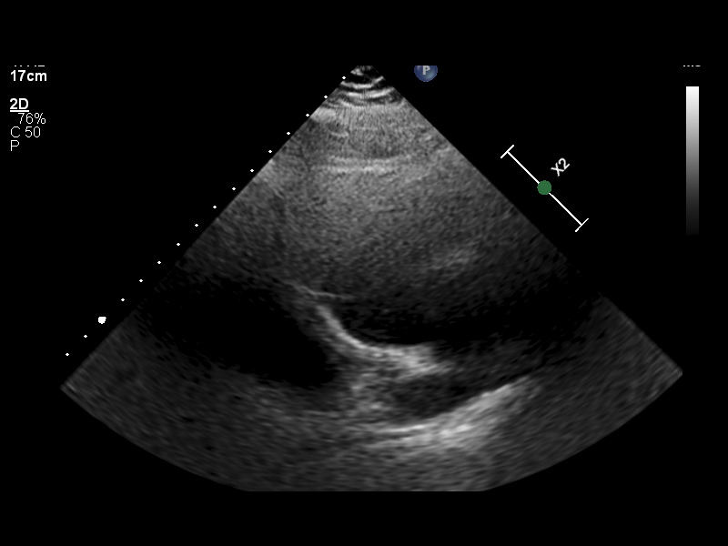
[im 23/86]
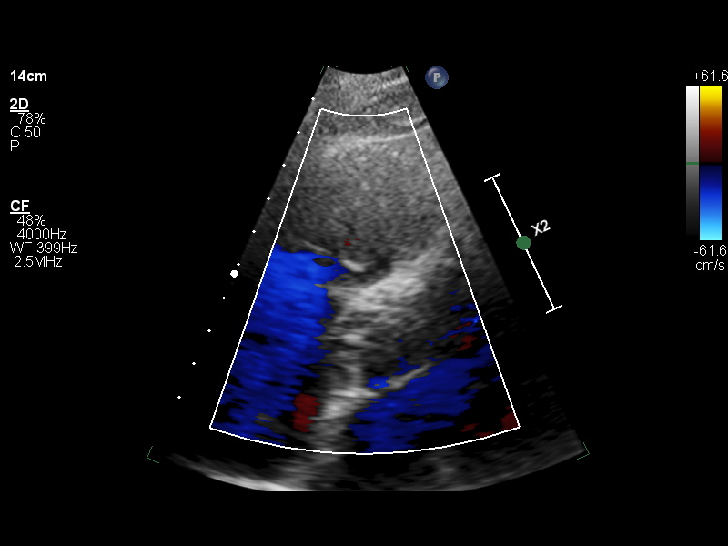
[im 30/86]
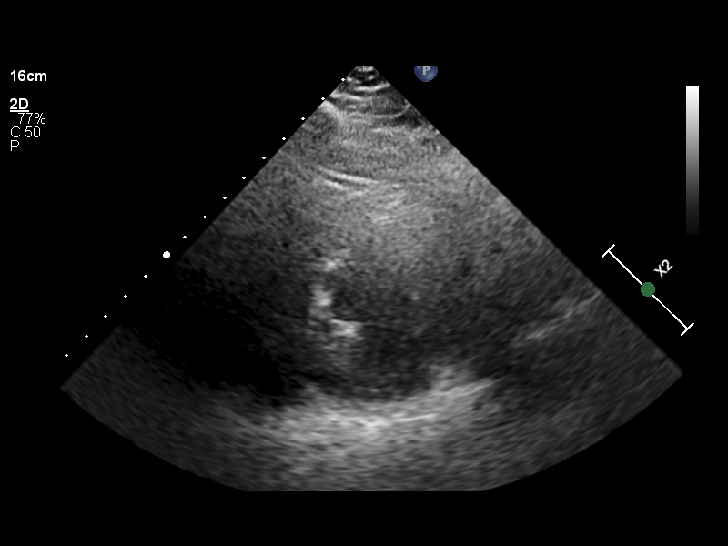
[im 34/86]
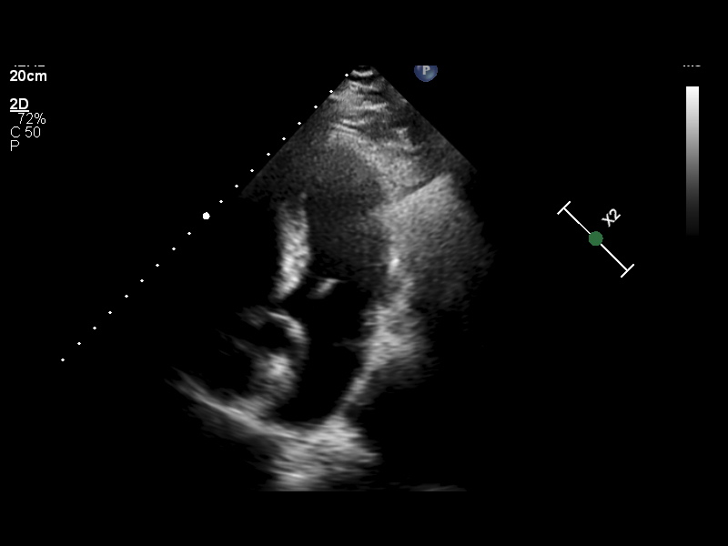
[im 41/86]
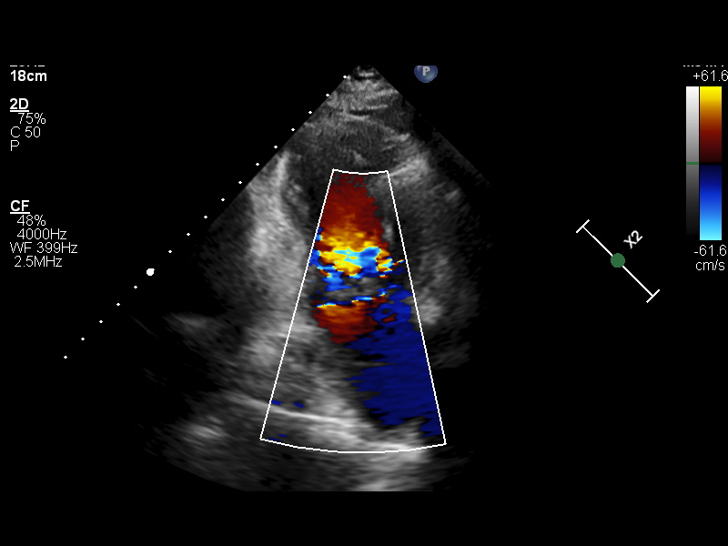
[im 45/86]
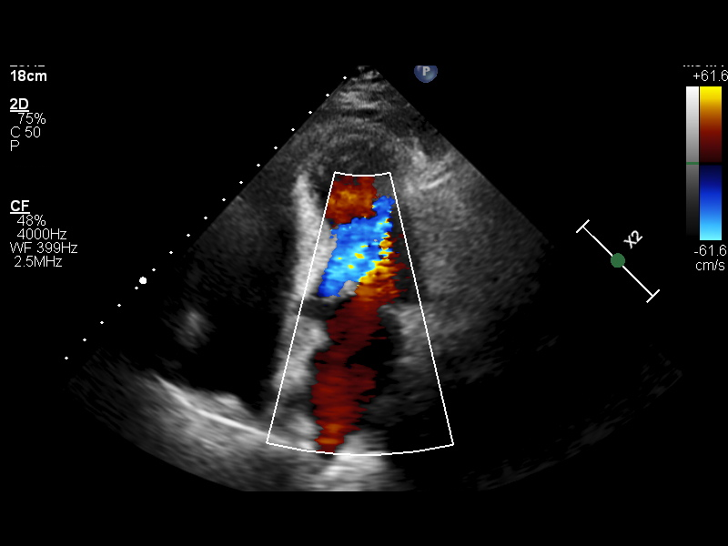
[im 52/86]
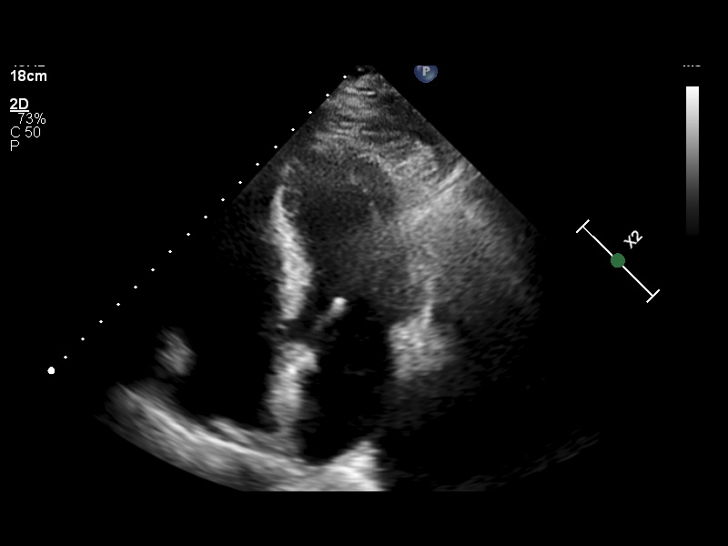
[im 63/86]
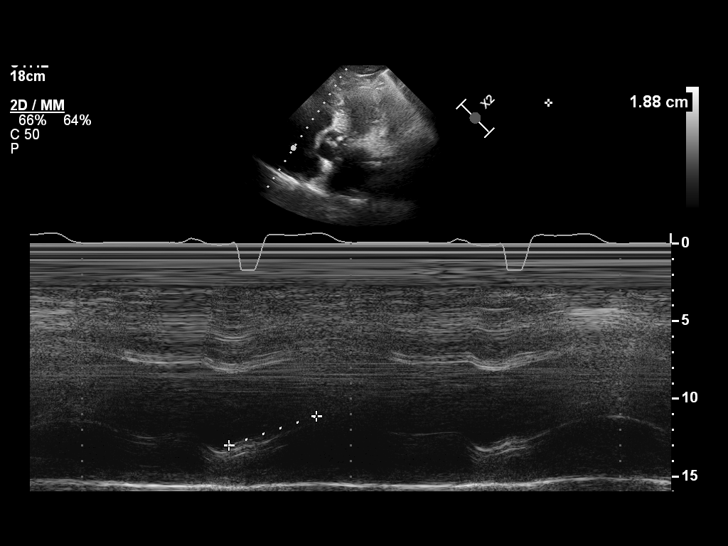
[im 71/86]
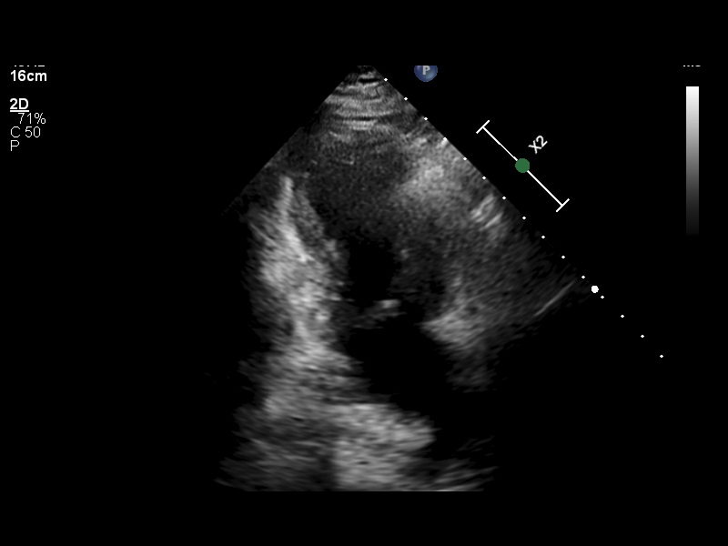
[im 78/86]
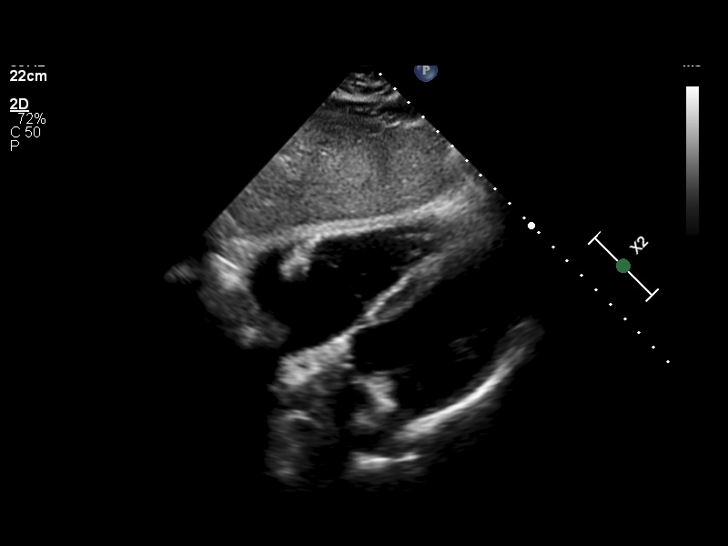
[im 86/86]
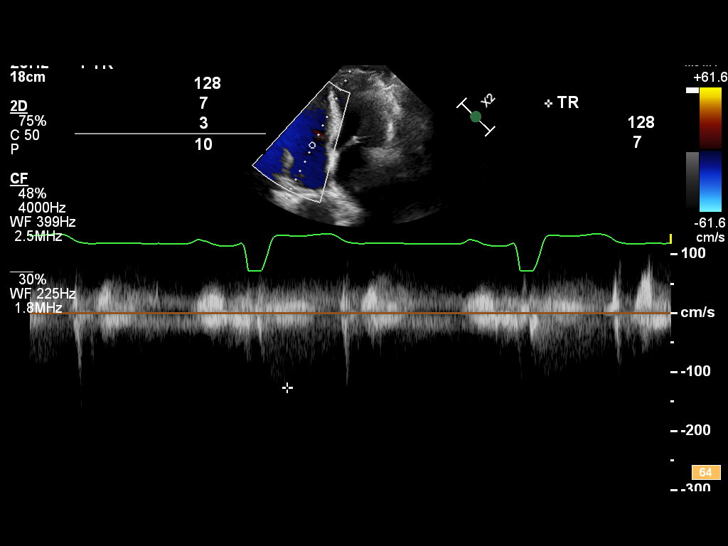

[13 of 24 positions shown; findings below may reference images not displayed]

05/22/21 -  2D + DOPPLER ECHO
Location Performed: [HOSPITAL]

Referring Provider:
Fellow:
Location of Interp:
Sonographer: External Staff

Indications: Abnormal EKG

Technically difficult study.

Vitals
Height   Weight   BSA (Calculated)   BP   Comments
177.8 cm (5' 10")   76 kg (167 lb 8.8 oz)   1.94   128/79

Interpretation Summary

The left ventricular size, wall thickness and systolic function are normal. The visually estimated
ejection fraction is 60%. There are no segmental wall motion abnormalities
The right ventricular size is normal. The right ventricular systolic function is normal.
Normal biatiral size.
No hemodynamically significant valvular disease.
No pericardial effusion.

See remainder of report for additional findings.

Echocardiographic Findings
Left Ventricle   The left ventricular size, wall thickness and systolic function are normal. The
visually estimated ejection fraction is 60%. There are no segmental wall motion abnormalities. Grade
I (mild) left ventricular diastolic dysfunction.
Right Ventricle   The right ventricular size is normal. The right ventricular systolic function is
normal. The pulmonary artery pressure could not be estimated due to inadequate tricuspid
regurgitation signal.
Left Atrium   Normal size.
Right Atrium   Normal size.
IVC/SVC   Elevated central venous pressure (5-10 mm Hg).
Mitral Valve   The mitral valve was not well seen. Non-specific thickening. No stenosis. Trace
regurgitation.
Tricuspid Valve   Normal valve structure. Trace regurgitation.
Aortic Valve   The valve is sclerotic. Probably tricuspid. No stenosis. No regurgitation.
Pulmonary   No regurgitation.
Aorta   The aortic root and ascending aorta are normal in size.
Pericardium   No pericardial effusion.

Left Heart 2D Measurements (Normal Ranges)
EF (Visual)
60 %
LVIDD
5.1 cm  (Range: 4.2 - 5.8)
LVIDS
3.4 cm  (Range: 2.5 - 4.0)
IVS
1.0 cm  (Range: 0.6 - 1.0)
LV PW
0.8 cm  (Range: 0.6 - 1.0)
LA Size
3.4 cm  (Range: 3.0 - 4.0)

Right Heart 2D   M-Mode Measurements (Normal Ranges) (Range)
RV Basal Dia
4.5 cm  (2.5 - 4.1)
MARGOT
3.1 cm2  (<18)
M-Mode TAPSE
1.9 cm  (>1.7)

Left Heart 2D Addnl Measurements (Normal Ranges)
LA Vol
57 mL  (Range: 18 - 58)
LA Vol Index
29.38  (Range: 16 - 34)
LV Mass
164 g  (Range: 88 - 224)
LV Mass Index
84 g/m2  (Range: 49 - 115)
RWT
0.31  (Range: <=0.42)

Aortic Root Measurements (Normal Ranges)
Sinus
3.1 cm  (Range: 2.8 - 4.0)
ELISANGELA SHOUP
3.4 cm

Tech Notes:

tm

## 2021-05-22 IMAGING — MR Head^Brain
10 series · 46 of 48 positions shown · IV contrast (with contrast)
Comparison: none

[Series 2: T1 · sagittal · 5.0mm · 0.45mm/px · 3 of 20 slices shown (1 of 2)]
[im 1/20]
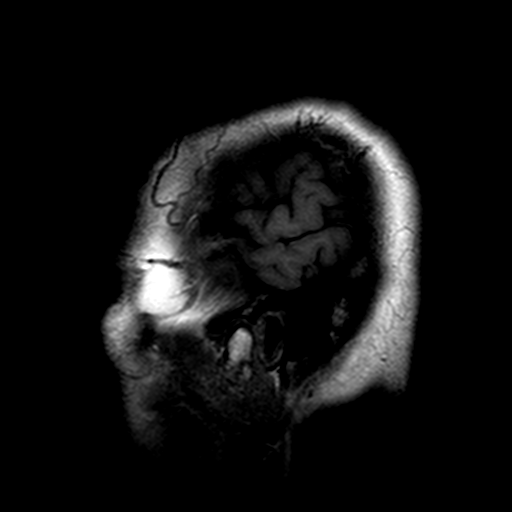
[im 10/20]
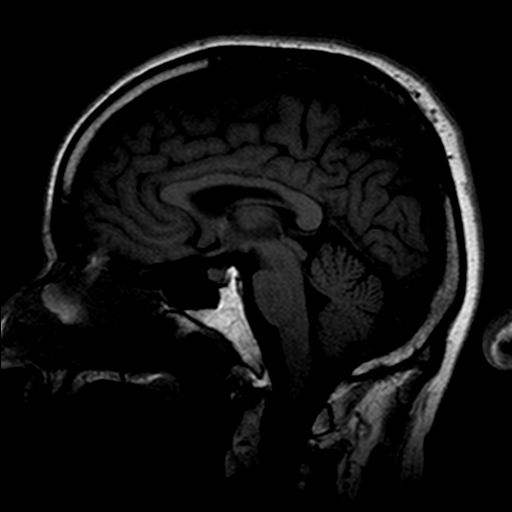
[im 20/20]
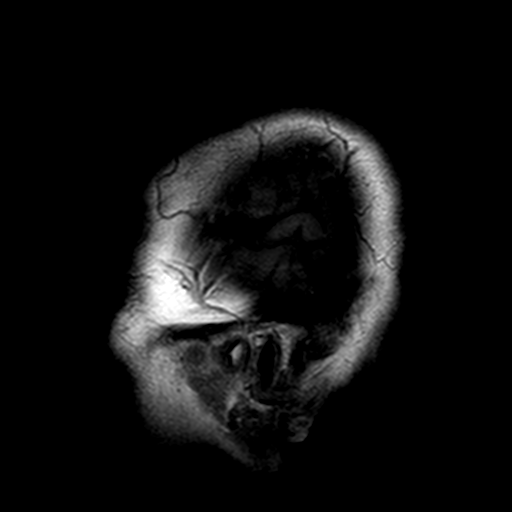

[Series 5: DWI · axial · 5.0mm · 1.80mm/px · z∈[-90,+38]mm · 11 of 63 slices shown (1 of 2)]
[im 1/63]
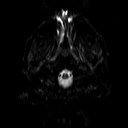
[im 7/63]
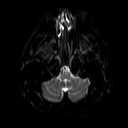
[im 13/63]
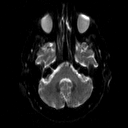
[im 19/63]
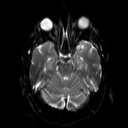
[im 25/63]
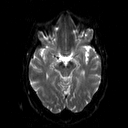
[im 32/63]
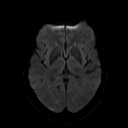
[im 38/63]
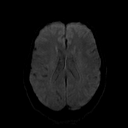
[im 44/63]
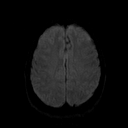
[im 50/63]
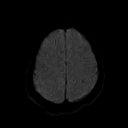
[im 56/63]
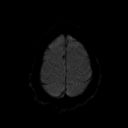
[im 63/63]
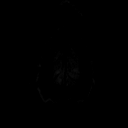

[Series 6: DWI · axial · 5.0mm · 1.80mm/px · z∈[-90,+38]mm · 4 of 21 slices shown (2 of 2)]
[im 1/21]
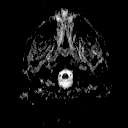
[im 7/21]
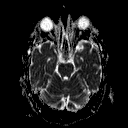
[im 14/21]
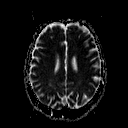
[im 21/21]
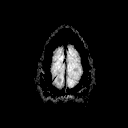

[Series 11: T2 · axial · 5.0mm · 0.72mm/px · z∈[-99,+49]mm · 4 of 24 slices shown (1 of 2)]
[im 1/24]
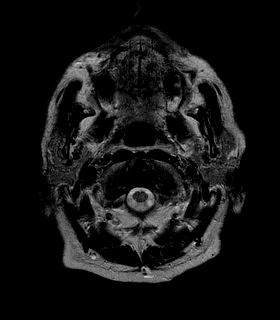
[im 8/24]
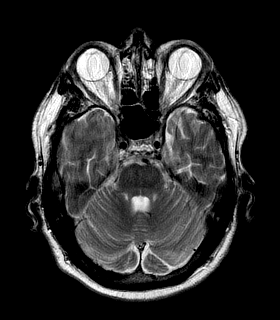
[im 16/24]
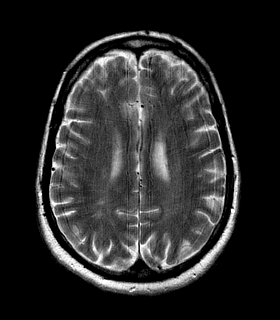
[im 24/24]
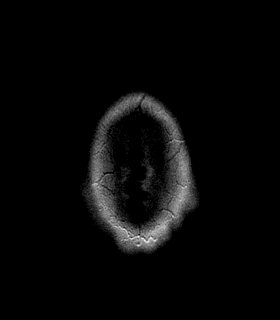

[Series 13: T1 · axial · 5.0mm · 0.45mm/px · z∈[-99,+49]mm · 4 of 24 slices shown (2 of 2)]
[im 1/24]
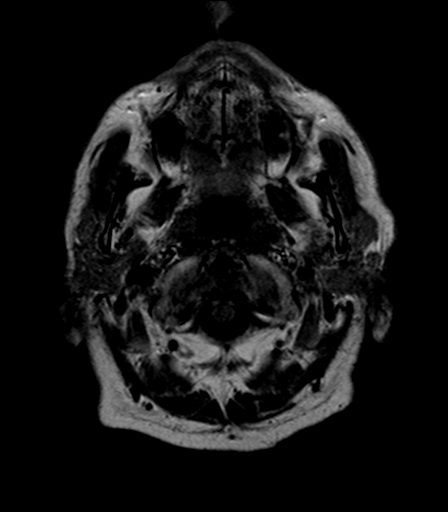
[im 8/24]
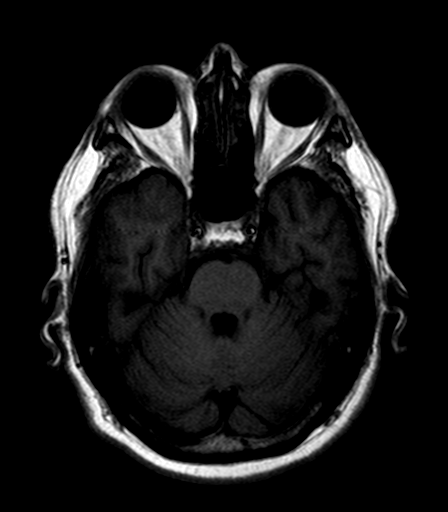
[im 16/24]
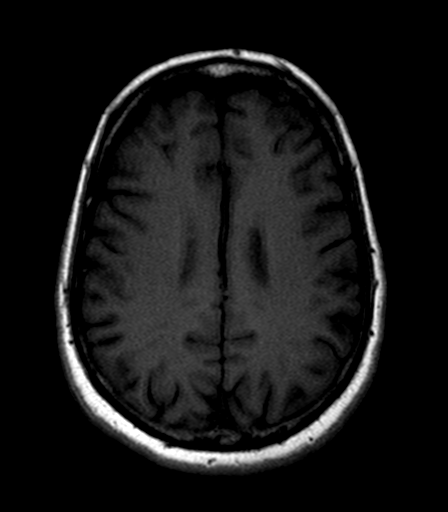
[im 24/24]
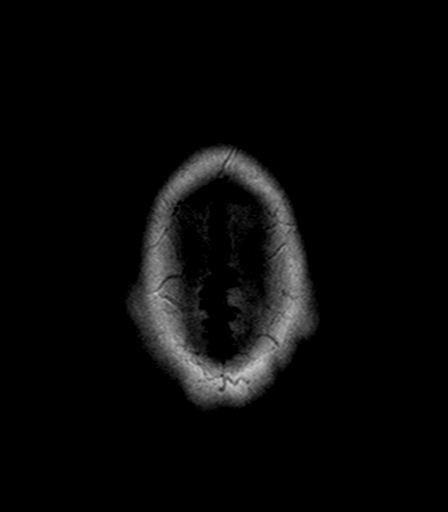

[Series 14: axial blood · axial · 5.0mm · 0.45mm/px · z∈[-99,-54]mm · 2 of 24 slices shown]
[im 1/24]
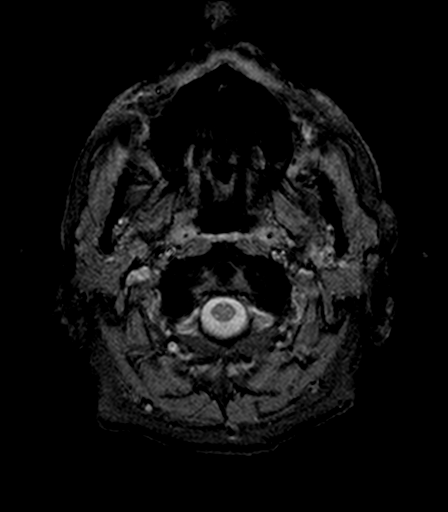
[im 8/24]
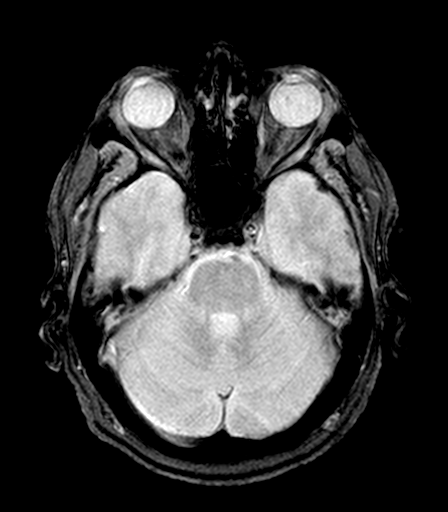

[Series 16: FLAIR · axial · 5.0mm · 0.45mm/px · z∈[-99,+49]mm · 4 of 24 slices shown]
[im 1/24]
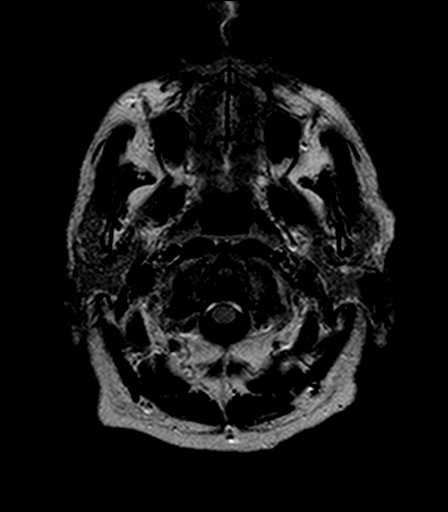
[im 8/24]
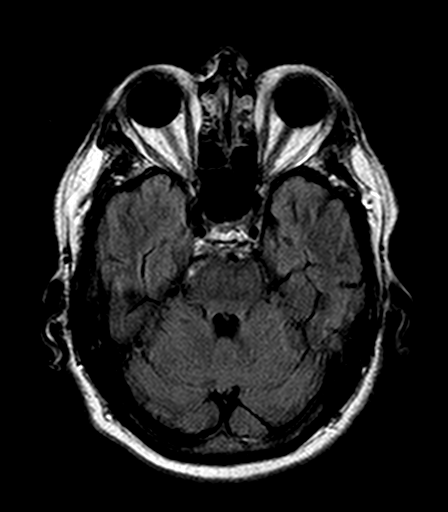
[im 16/24]
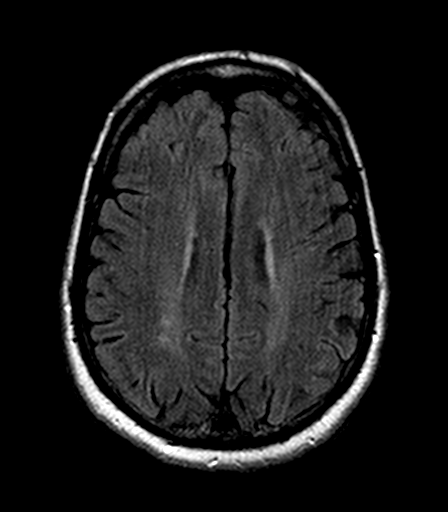
[im 24/24]
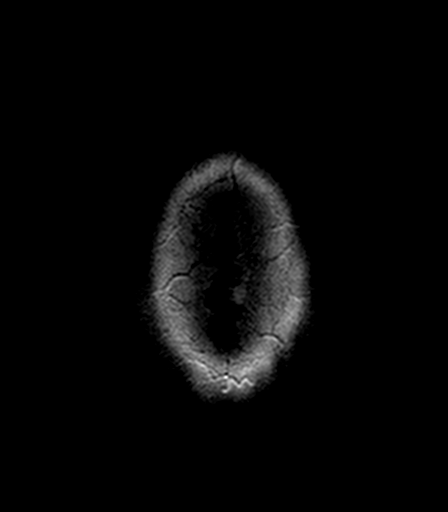

[Series 17: T2 · coronal · 5.0mm · 0.69mm/px · 5 of 26 slices shown (2 of 2)]
[im 1/26]
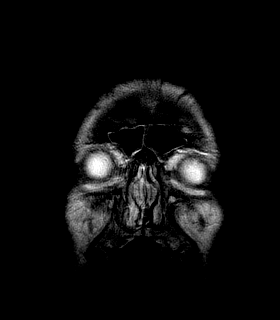
[im 7/26]
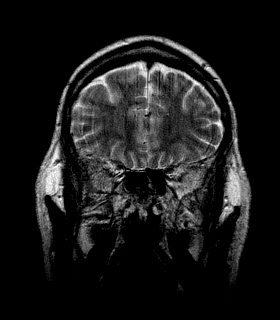
[im 13/26]
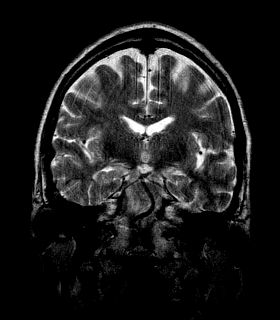
[im 19/26]
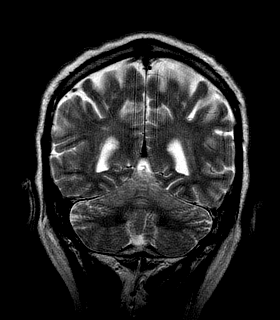
[im 26/26]
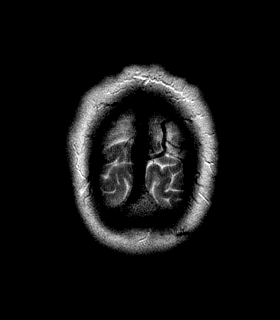

[Series 18: T1 fat-sat post-contrast · axial · 5.0mm · 0.90mm/px · z∈[-99,+49]mm · 4 of 24 slices shown (1 of 2)]
[im 1/24]
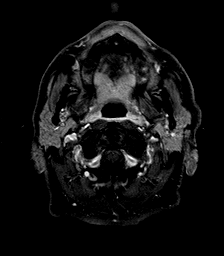
[im 8/24]
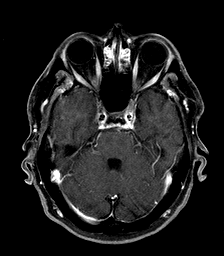
[im 16/24]
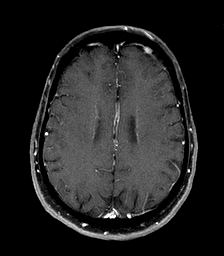
[im 24/24]
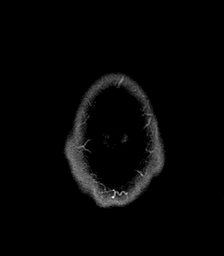

[Series 19: T1 fat-sat post-contrast · coronal · 5.0mm · 0.90mm/px · 5 of 26 slices shown (2 of 2)]
[im 1/26]
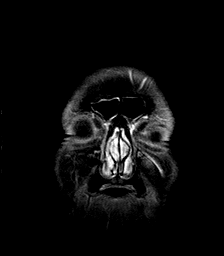
[im 7/26]
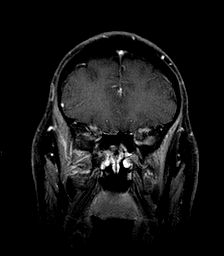
[im 13/26]
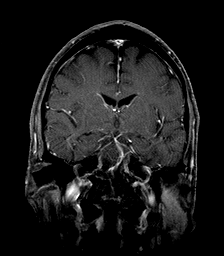
[im 19/26]
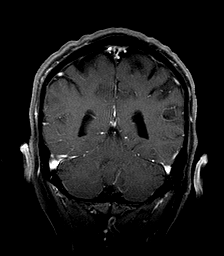
[im 26/26]
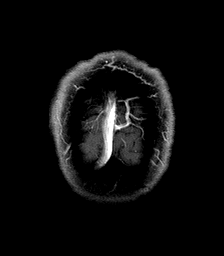

[46 of 48 positions shown; findings below may reference images not displayed]

DIAGNOSTIC STUDIES

EXAM

MAGNETIC RESONANCE IMAGING, BRAIN (INCLUDING BRAIN STEM) WITHOUT CONTRAST MATERIAL, FOLLOWED BY
CONTRAST MATERIAL(S) AND FURTHER SEQUENCES, CPT 10000

INDICATION

Transient loss of vision. Weakness.

TECHNIQUE

Multiplanar, multipulse sequence images of the head with and without contrast.

COMPARISONS

CT head 05/21/2021.

FINDINGS

Mild atrophy with associated volume loss and periventricular white matter changes. The midline
structures are intact. There is no evidence of tonsillar ectopia. Normal flow voids are visualized
within the vertebral, basilar and internal carotid arteries. There is no evidence of acute
hemorrhage or hydrocephalus. There is no evidence of restricted diffusion. There is no mass effect
or intra/extra-axial fluid collections. No abnormal intracranial enhancement. 7 millimeter focus of
enhancement within the frontal bone on the left, image 16. The orbits are normal. Moderate mucosal
thickening within the ethmoid air cells. Tiny polyp within the left maxillary sinus. The mastoid air
cells appear well aerated.

IMPRESSION

1. Mild atrophy. No acute hemorrhage or infarction.

2. There is a 7 millimeter focus of enhancement within the frontal bone on the left, image 16.
Findings are probably benign (Hemangioma, meningioma). A six-month follow-up can be performed if the
patient is high risk (smoker, history of malignancy).

Tech Notes:

ACUTE ONSET LOSS OF VISION, BLURRY VISION, RESOLVED AT THIS TIME. ACUTE ONSET WEAKNESS, LOSS OF
BALANCE 15 ML GADAVIST RG

## 2021-05-22 IMAGING — MR Head^Brain
1 series · 48 of 48 positions shown · non-contrast
Comparison: none

[Series 4: cow · axial · 0.8mm · 0.56mm/px · z∈[-94,-24]mm · 48 of 89 slices shown]
[im 1/89]
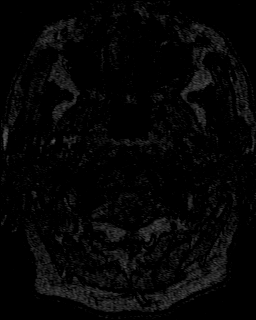
[im 2/89]
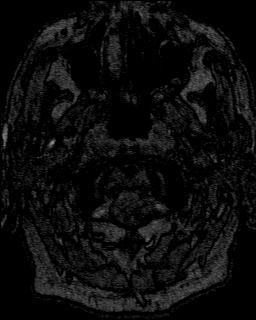
[im 4/89]
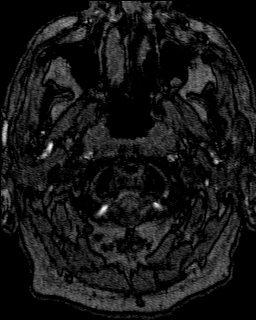
[im 6/89]
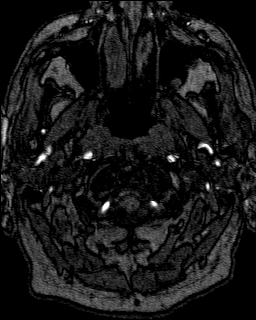
[im 8/89]
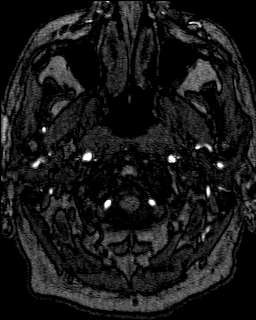
[im 10/89]
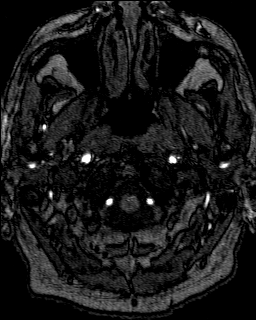
[im 12/89]
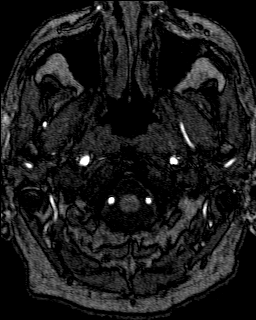
[im 14/89]
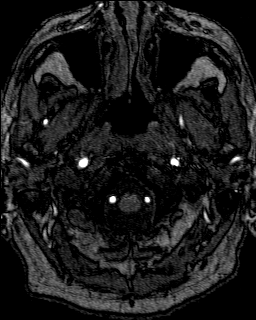
[im 15/89]
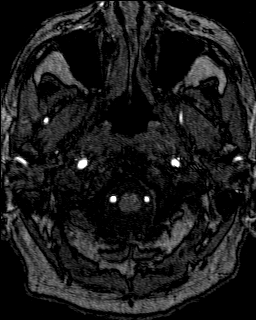
[im 17/89]
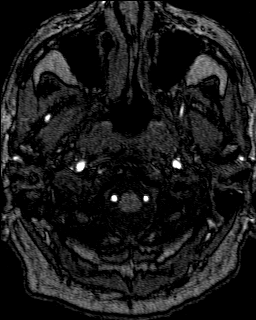
[im 19/89]
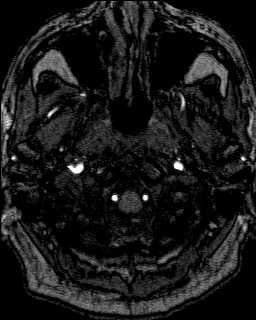
[im 21/89]
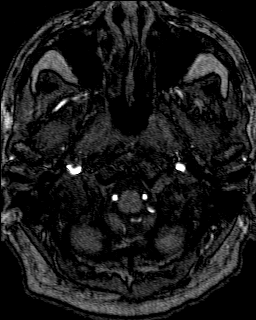
[im 23/89]
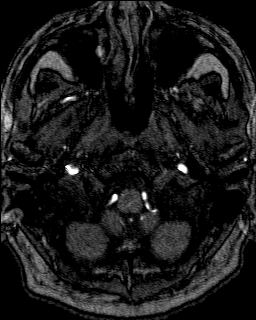
[im 25/89]
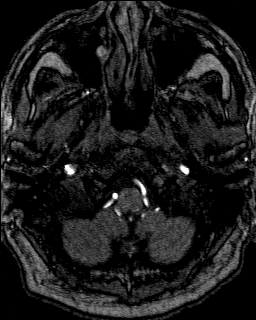
[im 27/89]
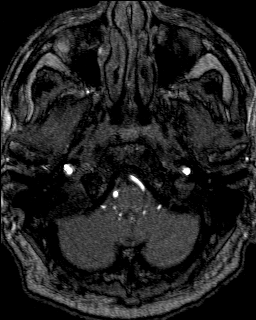
[im 29/89]
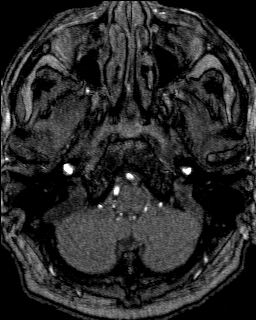
[im 30/89]
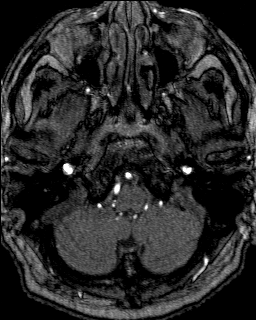
[im 32/89]
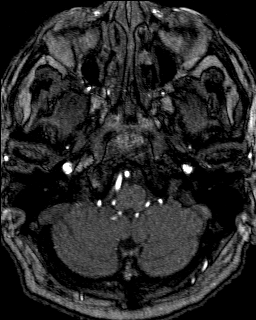
[im 34/89]
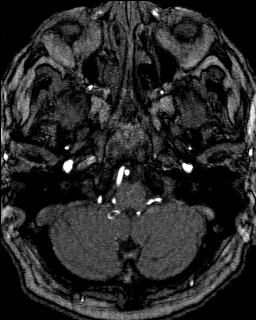
[im 36/89]
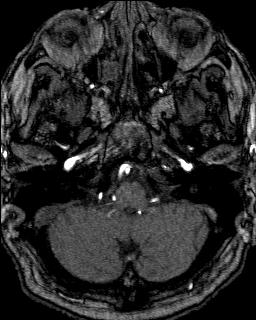
[im 38/89]
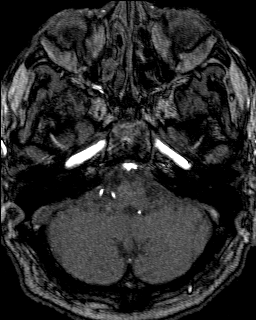
[im 40/89]
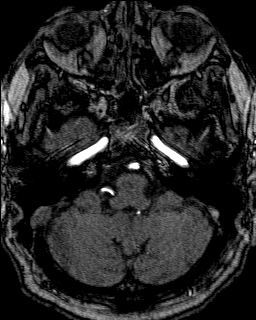
[im 42/89]
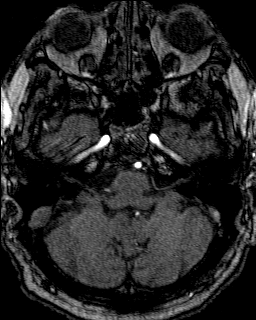
[im 44/89]
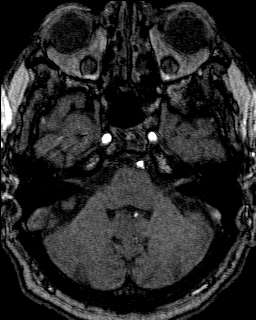
[im 45/89]
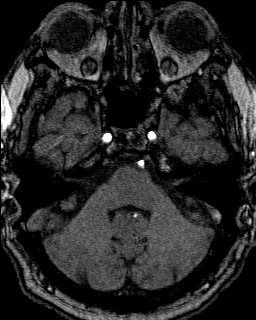
[im 47/89]
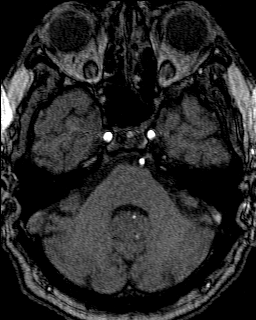
[im 49/89]
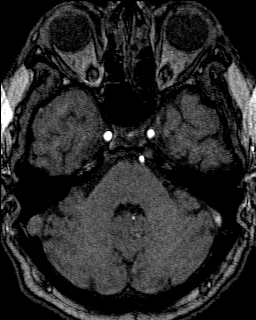
[im 51/89]
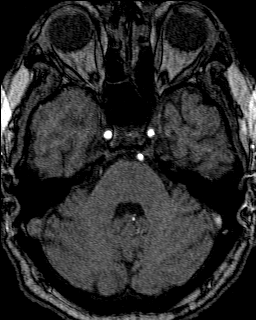
[im 53/89]
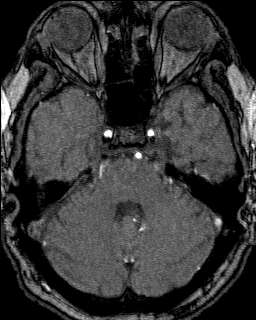
[im 55/89]
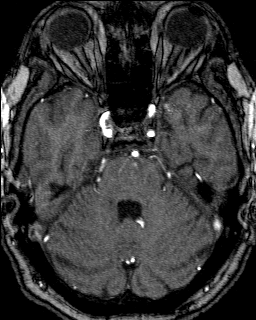
[im 57/89]
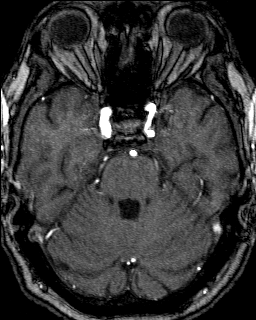
[im 59/89]
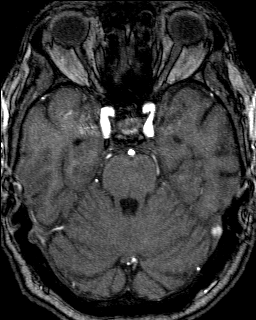
[im 60/89]
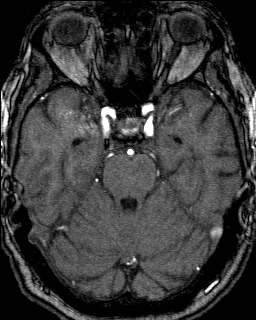
[im 62/89]
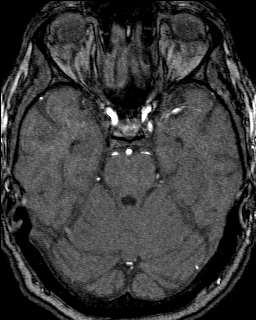
[im 64/89]
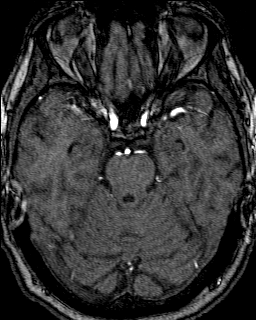
[im 66/89]
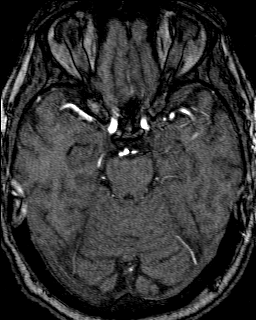
[im 68/89]
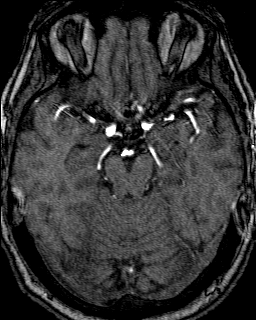
[im 70/89]
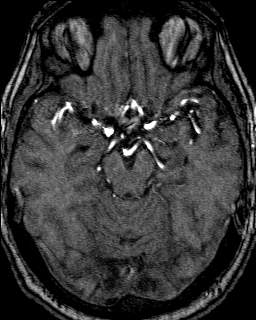
[im 72/89]
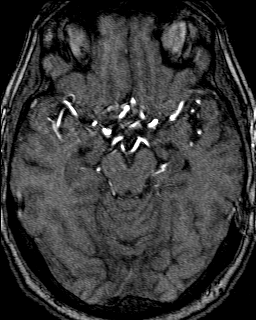
[im 74/89]
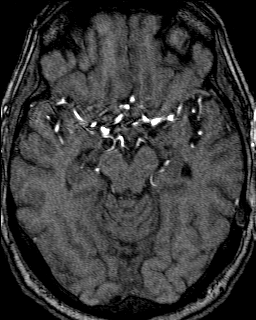
[im 75/89]
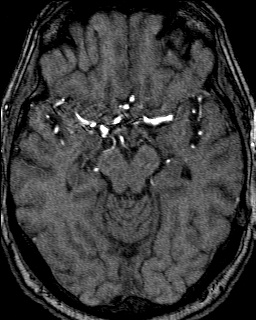
[im 77/89]
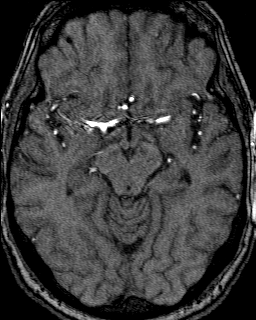
[im 79/89]
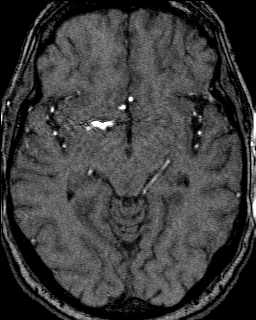
[im 81/89]
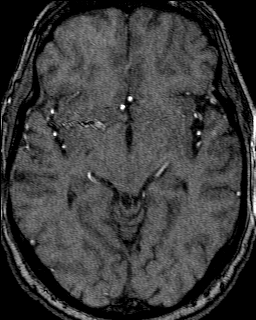
[im 83/89]
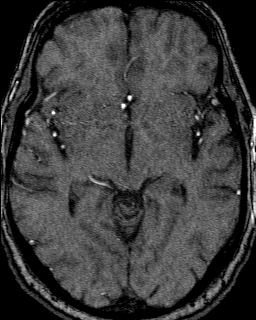
[im 85/89]
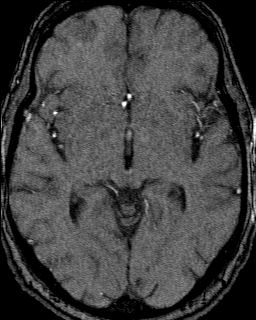
[im 87/89]
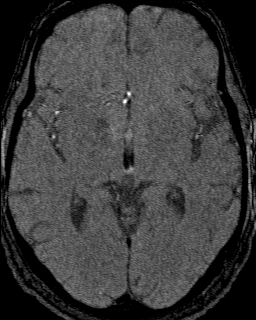
[im 89/89]
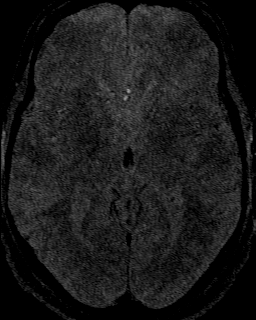

[48 of 48 positions shown; findings below may reference images not displayed]

DIAGNOSTIC STUDIES

EXAM

MAGNETIC RESONANCE ANGIOGRAPHY, HEAD, WITHOUT CONTRAST MATERIAL, CPT 60288

INDICATION

Abnormal blood pressure.

TECHNIQUE

3-D time of flight MRA of the Circle of Willis was performed without contrast. MIP reconstruction
was performed.

COMPARISONS

No priors available for comparison.

FINDINGS

Both distal internal carotid, anterior, middle, and posterior cerebral arteries are normal.

The vertebrobasilar system is normal. No aneurysm or AVM seen. No stenosis or occlusion is
identified.

IMPRESSION

Normal MR Angiography of the Circle of Willis.

Tech Notes:

ACUTE ONSET LOSS OF VISION, BLURRY VISION, RESOLVED AT THIS TIME. ACUTE ONSET WEAKNESS, LOSS OF
BALANCE 15 ML GADAVIST RG

## 2021-05-22 NOTE — Telephone Encounter
05/22/21 - Records have been requested per work que task / sjg  _________________________________    Erskine Emery, MD : [address] [place], [place] [address] Phone [phone] Fax [phone]-

## 2021-05-25 ENCOUNTER — Encounter: Admit: 2021-05-25 | Discharge: 2021-05-25 | Payer: MEDICARE

## 2021-05-28 ENCOUNTER — Encounter: Admit: 2021-05-28 | Discharge: 2021-05-28 | Payer: MEDICARE

## 2021-05-28 ENCOUNTER — Ambulatory Visit: Admit: 2021-05-28 | Discharge: 2021-05-28 | Payer: MEDICARE

## 2021-05-28 DIAGNOSIS — H547 Unspecified visual loss: Secondary | ICD-10-CM

## 2021-05-28 DIAGNOSIS — R531 Weakness: Secondary | ICD-10-CM

## 2021-05-28 DIAGNOSIS — I6521 Occlusion and stenosis of right carotid artery: Secondary | ICD-10-CM

## 2021-05-28 DIAGNOSIS — R0602 Shortness of breath: Secondary | ICD-10-CM

## 2021-05-28 DIAGNOSIS — I779 Disorder of arteries and arterioles, unspecified: Secondary | ICD-10-CM

## 2021-05-28 LAB — LIPID PROFILE
CHOLESTEROL: 184 mg/dL — ABNORMAL LOW (ref <200)
TRIGLYCERIDES: 265 mg/dL — ABNORMAL HIGH (ref <150)

## 2021-05-28 LAB — COMPREHENSIVE METABOLIC PANEL
CHLORIDE: 101 MMOL/L (ref 98–110)
GLUCOSE,PANEL: 72 mg/dL — ABNORMAL LOW (ref 70–100)
POTASSIUM: 4.8 MMOL/L — ABNORMAL HIGH (ref <100)
SODIUM: 138 MMOL/L — ABNORMAL LOW (ref >40)

## 2021-05-28 MED ORDER — ATORVASTATIN 40 MG PO TAB
40 mg | ORAL_TABLET | Freq: Every day | ORAL | 3 refills | Status: AC
Start: 2021-05-28 — End: ?

## 2021-05-28 NOTE — Patient Instructions
It was good to see you today!     Have lab work drawn today.     Attend stress test on 10/10 at 845a.     Neurology will call you to schedule.    Please contact the cardiology [facility] Team at [phone] or send a message through the MyChart system if you have questions or concerns. We will get back to you as soon as possible.     NOTE: MyChart messages and phone calls received on weekends, on holidays, and after 4 pm on weekdays will NOT be seen until the following business day. If you have an urgent matter during these times, please call [phone] to reach the on-call team.     You may receive test results in MyChart before the ordering provider has reviewed them. Our care team will follow up with you after reviewing the tests to discuss your care. This may take up to 5-7 business days if results are not urgently needing to be addressed. Thank you for your patience.      If you need prescription refills, please contact your pharmacy.  Cardiology scheduling number: [phone]      [facility]  Nuclear Stress Test Instructions    Your cardiologist has asked that you have a nuclear stress test (also known as a Myocardial Perfusion Imaging (MPI) test.    This evaluation of your heart muscle consists of two sets of nuclear images and either a Treadmill stress test or a chemical stress test, decided by you and your physician.  You will get an IV placed in your arm for the test.    You will need to be able to raise your arm up by your head for about 20 minutes and lie on your back for about 10 minutes.  Please discuss this with your doctor or talk with the nuclear technologist or nurse if these are a problem for you.    It is recommended not to schedule any other appointment for the same day.      Wear comfortable clothing and walking shoes if you are walking on the treadmill.  Women should wear shorts or comfortable pants instead of dresses.   Sweatshirts or T-shirts work really well for imaging.    There may be enough time to leave to get a snack after the stress portion of the test.   The Technologist will give you a list of appropriate types of food you may eat and tell you what time to return for second set of images.    PLEASE NO CAFFEINE 24 HOURS PRIOR TO TEST:  Examples include coffee, tea, decaffeinated drinks, colas, [facility], Dr. Reino Kent.  Some orange sodas and root beers have caffeine, please check.  No energy drinks, Excedrin, Midol, or any foods CONTAINING CHOCOLATE.   Consuming Caffeine may postpone your test.    PLEASE DO NOT EAT OR DRINK THE MORNING OF YOUR TEST.  Water is ok to drink with your morning medications.  Please hold these medications the day of test: Vitamins/Supplements      DIABETIC PATIENTS:  if insulin dependent:  please take one third of your insulin with two pieces of dry toast and a small juice.  Bring remaining 2/3   insulin and oral diabetic medications with you to your test.    PLEASE NO TOBACCO PRODUCTS BETWEEN SCANS  You will NOT need a driver for this test.  But are welcome to bring a visitor with you.  Visitors will not be able to accompany you back to stress room.   Please do not bring children to the nuclear stress test.    TEST FINDINGS:   You will receive the results of the test within 7 business days of completion of this test by telephone. If you have any questions concerning your nuclear stress test or if you do not hear from your cardiologist/or nurse with 7 business days, please call our office.

## 2021-05-28 NOTE — Progress Notes
Date of Service: 05/28/2021    Daniel Braun is a 75 y.o. male.       HPI     The patient is a pleasant 75 year old gentleman who is seen with his wife for further evaluation of multiple symptoms.  The patient lives in [place].  Over the last month he has had several different health issues which started with some initial weakness in his right leg.  This seemed to resolve spontaneously.  He then had similar symptoms where his left arm seemed weak and not functioning normally.  He initially did not see a doctor and his symptoms resolved.  About a week ago he [place] the interstate [place] about 65 [place] and he noticed a marked loss of vision involving both eyes.  He was able to slow down and stop the car without having an accident.  His wife ended up driving the remainder of the trip.  At a later point he developed some chills and shakes last week and was subsequently admitted to the hospital in [place].    Eat a series of test performed while he was hospitalized including a CT of his head and MRI both of which were relatively unremarkable.  He did see an eye doctor and was told that his eyes were okay.  He was encouraged to follow-up with cardiology.  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  He was started on low-dose aspirin 81 mg daily.  He has not seen a neurologist.  He is unclear of his cholesterol status.  His eye symptoms lasted about 30 minutes and then resolved completely.  He has had no further visual changes.  He has had no syncope or near syncope and no significant palpitations.  He does have some exertional dyspnea which has been noted especially by his wife.  He has a long history of tobacco use as well.  He has no chest discomfort of any type.  He does have a lot of fatigue.  He had an echocardiogram last week showing normal left ventricular systolic function with an ejection fraction of 60%.  He had mild diastolic dysfunction.    His exam today is normal.  He has no neurologic or cardiac abnormalities on exam.  His blood pressure is 130/50.  His body mass index is 24.6.  He is adopted but he found out that one of his real sisters had previous problems with a heart attack and his sister died with cancer.  He has a history of smoking up to 1 pack of cigarettes daily.  He currently smokes about 1/2 pack of cigarettes daily and he says he is smoked since age 43.  He does not drink significant alcohol.  He has no allergies and his past medical history is otherwise fairly unremarkable.  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         Vitals:    05/28/21 1120   BP: 130/50   BP Source: Arm, Left Upper   Pulse: 62   SpO2: 96%   O2 Device: None (Room air)   PainSc: Zero   Weight: 78 kg (172 lb)   Height: 177.8 cm (5' 10)     Body mass index is 24.68 kg/m?Marland Kitchen     Past Medical History  There are no problems to display for this patient.        Review  of Systems   Constitutional: Negative. Negative for decreased appetite, malaise/fatigue, weight gain and weight loss.   HENT: Negative.    Eyes: Positive for vision loss in left eye and vision loss in right eye.   Cardiovascular: Positive for dyspnea on exertion. Negative for chest pain, leg swelling and syncope.   Respiratory: Positive for shortness of breath. Negative for cough.    Endocrine: Negative.    Hematologic/Lymphatic: Negative.    Skin: Negative.  Negative for color change, rash and suspicious lesions.   Musculoskeletal: Positive for muscle weakness. Negative for joint swelling.   Gastrointestinal: Negative.  Negative for hematochezia and melena.   Genitourinary: Negative.  Negative for hematuria.   Neurological: Positive for light-headedness.   Psychiatric/Behavioral: Negative.  Negative for altered mental status and memory loss.   Allergic/Immunologic: Negative.        Physical Exam   Constitutional: He appears well-developed and well-nourished. No distress.   HENT:   Head: Normocephalic and atraumatic.   Eyes: Pupils are equal, round, and reactive to light. EOM are normal.   Neck: No JVD present. No thyromegaly present.   Cardiovascular: Regular rhythm. Exam reveals no S3.   No murmur heard.  Pulmonary/Chest: Breath sounds normal. No tachypnea. No respiratory distress.   Abdominal: Soft. Normal appearance and normal aorta. There is no splenomegaly or hepatomegaly. There is no abdominal tenderness.   Musculoskeletal:         General: No tenderness or edema. Normal range of motion.      Cervical back: Neck supple.   Lymphadenopathy:     He has no cervical adenopathy.   Neurological: He is alert and oriented to person, place, and time. He is not disoriented. Coordination normal.   Skin: Skin is warm, dry and intact. He is not diaphoretic. No erythema.   Psychiatric: His mood appears not anxious. His affect is not inappropriate.         Cardiovascular Studies      Cardiovascular Health Factors  Vitals BP Readings from Last 3 Encounters:   05/28/21 130/50   05/22/21 128/79     Wt Readings from Last 3 Encounters:   05/28/21 78 kg (172 lb)   05/22/21 76 kg (167 lb 8.8 oz)     BMI Readings from Last 3 Encounters:   05/28/21 24.68 kg/m?   05/22/21 24.04 kg/m?      Smoking Social History     Tobacco Use   Smoking Status Current Every Day Smoker   ? Packs/day: 0.50   ? Years: 15.00   ? Pack years: 7.50   ? Types: Cigarettes   Smokeless Tobacco Former Neurosurgeon      Lipid Profile No results found for: CHOL  No results found for: HDL  No results found for: LDL  No results found for: TRIG   Blood Sugar No results found for: HGBA1C  No results found for: GLU, GLUF, GLUPOC       Problems Addressed Today  Encounter Diagnoses   Name Primary?   ? Stenosis of right carotid artery Yes   ? Shortness of breath    ? Weakness    ? Vision loss        Assessment and Plan     1.  Patient has a multitude of symptoms including episodes of transient right leg weakness left arm weakness and transient vision loss.  The sound concerning for some type of underlying neurologic problem although his work-up so far has been fairly unrevealing  2.  Exertional dyspnea which may be related to his underlying long history of tobacco use  3.  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  Unclear lipid status  5.  Prominent fatigue    Plan  The patient is a little hard to sort out.  We did recommend he see a neurologist and we have put in a referral for neurology consult.  He was encouraged to stay on the aspirin at 81 mg daily.  His various studies were reviewed with he and his wife.  He will get a CMP and a lipid profile today before lunch.  We added Lipitor 40 mg daily to his current aspirin.  He was scheduled for a stress test which she will do in [place].  He was also encouraged to avoid tobacco.  He will work on a healthy diet and exercise.  If his treadmill looks okay I would probably hold off on additional cardiac testing and await the neurology consult evaluation.  I did not schedule him for routine follow-up but we would be glad to see him back in the future if needed or if he has additional problems.         Current Medications (including today's revisions)  ? aspirin EC 81 mg tablet Take 81 mg by mouth daily. Take with food.   ? atorvastatin (LIPITOR) 40 mg tablet Take one tablet by mouth daily.

## 2021-06-11 ENCOUNTER — Encounter: Admit: 2021-06-11 | Discharge: 2021-06-11 | Payer: MEDICARE

## 2021-06-11 ENCOUNTER — Ambulatory Visit: Admit: 2021-06-11 | Discharge: 2021-06-11 | Payer: MEDICARE

## 2021-06-11 DIAGNOSIS — I6529 Occlusion and stenosis of unspecified carotid artery: Secondary | ICD-10-CM

## 2021-06-11 DIAGNOSIS — R0600 Dyspnea, unspecified: Secondary | ICD-10-CM

## 2021-06-11 MED ORDER — RP DX TC-99M SESTAMIBI MCI
10.2 | Freq: Once | INTRAVENOUS | 0 refills | Status: AC
Start: 2021-06-11 — End: ?

## 2021-06-11 MED ORDER — RP DX TC-99M SESTAMIBI MCI
20.8 | Freq: Once | INTRAVENOUS | 0 refills | Status: AC
Start: 2021-06-11 — End: ?

## 2021-06-17 ENCOUNTER — Encounter: Admit: 2021-06-17 | Discharge: 2021-06-17 | Payer: MEDICARE

## 2021-06-17 NOTE — Telephone Encounter
Spoke to [place] and patient - results for stress test are not finalized. Advised pt that TJS will review and give recommendations as soon as he is able. Explained low risk study results within nursing scope. Pt/spouse verbalized understanding and agreeable. Confirmed pt is on books for neuro.

## 2021-06-22 ENCOUNTER — Encounter: Admit: 2021-06-22 | Discharge: 2021-06-22 | Payer: MEDICARE

## 2021-06-22 DIAGNOSIS — I248 Other forms of acute ischemic heart disease: Secondary | ICD-10-CM

## 2021-06-22 DIAGNOSIS — R9439 Abnormal result of other cardiovascular function study: Secondary | ICD-10-CM

## 2021-06-22 DIAGNOSIS — R5383 Other fatigue: Secondary | ICD-10-CM

## 2021-06-22 DIAGNOSIS — R0609 Other forms of dyspnea: Secondary | ICD-10-CM

## 2021-06-22 MED ORDER — DOCUSATE SODIUM 100 MG PO CAP
100 mg | Freq: Every day | ORAL | 0 refills | PRN
Start: 2021-06-22 — End: ?

## 2021-06-22 MED ORDER — ALUMINUM-MAGNESIUM HYDROXIDE 200-200 MG/5 ML PO SUSP
30 mL | ORAL | 0 refills | PRN
Start: 2021-06-22 — End: ?

## 2021-06-22 MED ORDER — ASPIRIN 325 MG PO TAB
325 mg | Freq: Once | ORAL | 0 refills
Start: 2021-06-22 — End: ?

## 2021-06-22 MED ORDER — ACETAMINOPHEN 325 MG PO TAB
650 mg | ORAL | 0 refills | PRN
Start: 2021-06-22 — End: ?

## 2021-06-22 NOTE — Telephone Encounter
-----   Message from Roderic Palau, MD sent at 06/21/2021  4:51 PM CDT -----  Please let the patient know that I reviewed his recent laboratory studies.  His total cholesterol is normal but the rest of his lipid profile is fairly abnormal.  He has an elevated triglyceride level.  He also has too much of the bad cholesterol (LDL) and not enough of the good cholesterol (HDL).    His chemistry profile turned out excellent.  His kidney function, blood sugar, liver enzymes, and electrolytes all looked excellent.    For now we will stay with the low-dose aspirin daily and with the Lipitor which we added when he was in clinic.  The should lower his risk especially given the plaque noted in his recent carotid ultrasound.    Have encouraged him to get a repeat cholesterol profile and chemistry panel in about 3 months on a fasting basis.  He could either have that done with his primary physician or have it done through [place].  For now I do not think any other changes are necessary.    Please let me know if there are additional questions    Thank you    TS

## 2021-06-22 NOTE — Telephone Encounter
Reviewed results/recommendations with pt and pt's wife. Agreeable to OV with Dr. Anda Kraft on 11/7 and LHC on 11/9. All questions addressed.

## 2021-06-22 NOTE — Telephone Encounter
Reviewed results/recommendations with pt's wife. Pt states they are still awaiting the results of the stress test. Will route to Dr. Anda Kraft for review. All questions addressed.

## 2021-06-23 ENCOUNTER — Encounter: Admit: 2021-06-23 | Discharge: 2021-06-23 | Payer: MEDICARE

## 2021-06-23 DIAGNOSIS — R0609 Other forms of dyspnea: Secondary | ICD-10-CM

## 2021-06-23 DIAGNOSIS — R9439 Abnormal result of other cardiovascular function study: Secondary | ICD-10-CM

## 2021-06-23 DIAGNOSIS — R5383 Other fatigue: Secondary | ICD-10-CM

## 2021-06-23 NOTE — Progress Notes
Medicare is listed as patient's primary insurance coverage.  Pre-certification is not required for hospitalizations.

## 2021-06-29 ENCOUNTER — Ambulatory Visit: Admit: 2021-06-29 | Discharge: 2021-06-29 | Payer: MEDICARE

## 2021-06-29 ENCOUNTER — Encounter: Admit: 2021-06-29 | Discharge: 2021-06-29 | Payer: MEDICARE

## 2021-06-29 DIAGNOSIS — E7800 Pure hypercholesterolemia, unspecified: Secondary | ICD-10-CM

## 2021-06-29 DIAGNOSIS — R0602 Shortness of breath: Secondary | ICD-10-CM

## 2021-06-29 DIAGNOSIS — I452 Bifascicular block: Secondary | ICD-10-CM

## 2021-06-29 DIAGNOSIS — I779 Disorder of arteries and arterioles, unspecified: Secondary | ICD-10-CM

## 2021-06-29 DIAGNOSIS — Z136 Encounter for screening for cardiovascular disorders: Secondary | ICD-10-CM

## 2021-06-29 DIAGNOSIS — R9439 Abnormal result of other cardiovascular function study: Secondary | ICD-10-CM

## 2021-06-29 DIAGNOSIS — I6523 Occlusion and stenosis of bilateral carotid arteries: Secondary | ICD-10-CM

## 2021-06-29 DIAGNOSIS — I25118 Atherosclerotic heart disease of native coronary artery with other forms of angina pectoris: Secondary | ICD-10-CM

## 2021-06-29 LAB — CBC
RBC COUNT: 4.5 M/UL (ref 4.4–5.5)
WBC COUNT: 6.1 K/UL (ref >60)

## 2021-06-29 LAB — BASIC METABOLIC PANEL
ANION GAP: 9 g/dL (ref 3–12)
BLD UREA NITROGEN: 11 mg/dL (ref 7–25)
CALCIUM: 9 mg/dL (ref 8.5–10.6)
CHLORIDE: 100 MMOL/L (ref 98–110)
CO2: 31 MMOL/L — ABNORMAL HIGH (ref 21–30)
CREATININE: 1 mg/dL (ref 0.4–1.24)
EGFR: >60 mL/min (ref >60)
GLUCOSE,PANEL: 93 mg/dL (ref 70–100)
POTASSIUM: 4.4 MMOL/L (ref 3.5–5.1)
SODIUM: 140 MMOL/L (ref 137–147)

## 2021-06-29 NOTE — Patient Instructions
CARDIAC CATHETERIZATION   PRE-ADMISSION INSTRUCTIONS    Patient Name: Daniel Braun  MRN#: [id number]  Date of Birth: 1946/04/27 (75 y.o.)  Today's Date: 06/29/2021    PROCEDURE:  You are scheduled for a Coronary Angiogram with possible Angioplasty/Stenting with  Dr. Francine Graven .    PROCEDURE DATE AND ARRIVAL TIME:  Your procedure date is 07/01/21.  You will receive a call from the Cath lab staff between 8:00 a.m. and noon on the business day prior to your procedure to let you know at what time to arrive on the day of your procedure.    Please check in at the Admitting Desk in the [place] Day for your procedure. [facility] Entrance and and take a right. Continue down the hallway past the Cardiovascular Medicine office. That hall will take you into the [facility]. Check in at the desk on the left side.)     (If you have further questions regarding your arrival time for the CV lab, please call [phone] by 3:00pm the day before your procedure. Please leave a message with your name and number, your call will be returned in a timely manner.)    PRE-PROCEDURE APPOINTMENTS:    06/29/21 at 2:00 pm   Office visit to update history and physical (requirement within 30 days of procedure)  with  Dr. Francine Graven  at Cardiovascular Medicine  [place] office.         Pre-Admission lab work required within 14 days of procedure: BMP and CBC at the lab of your choice.       FOOD AND DRINK INSTRUCTIONS  Nothing to eat after midnight before your procedure. No caffeine for 24 hours prior to your procedure. You will be under moderate sedation for your procedure.  You may drink clear liquids up to an hour before hospital arrival. This will be confirmed by the Cath lab staff the day before your procedure.     SPECIAL MEDICATION INSTRUCTIONS  Any new prescriptions will be sent to your pharmacy listed on file with [place].     Please either take 4 baby aspirins (4 times 81mg ) or one full strength NON-COATED 325mg  aspirin the morning of your procedure.       HOLD ALL erectile dysfunction medications for 3 days, unless prescribed for pulmonary hypertension.  HOLD ALL over the counter vitamins or supplements on the morning of your procedure.      Additional Instructions  If you wear CPAP, please bring your mask and machine with you to the hospital.    Take a bath or shower with anti-bacterial soap the evening before, or the morning of the procedure.     Bring photo ID and your health insurance card(s).    Arrange for a driver to take you home from the hospital. Please arrange for a friend or family member to take you home from this test. You cannot take a Taxi, Benedetto Goad, or public transportation as there has to be a responsible person to help care for you after sedation    Bring an accurate list of your current medications with you to the hospital (all medications and supplements taken daily).  Please use the medication list below and write in the date and time when you took your last dose before your procedure. Update this list of medications as needed.      Wear comfortable clothes and don't bring valuables, other than photo identification card, with you to the hospital.    Please pack a bag for  an overnight stay.     Please review your pre-procedure instructions and bring them with you on the day of your procedure.  Call the office at  [phone]  with any questions. You may ask to speak with Dr. Hoy Finlay nurse.        ALLERGIES  No Known Allergies    CURRENT MEDICATIONS  Outpatient Encounter Medications as of 06/29/2021   Medication Sig Dispense Refill    aspirin EC 81 mg tablet Take 81 mg by mouth daily. Take with food.      atorvastatin (LIPITOR) 40 mg tablet Take one tablet by mouth daily. 90 tablet 3     No facility-administered encounter medications on file as of 06/29/2021.       _________________________________________  Form completed by: Brigitte Pulse, RN  Date completed: 06/29/21  Method: In person and given to the patient.

## 2021-07-01 ENCOUNTER — Encounter: Admit: 2021-07-01 | Discharge: 2021-07-01 | Payer: MEDICARE

## 2021-07-02 ENCOUNTER — Encounter: Admit: 2021-07-02 | Discharge: 2021-07-02 | Payer: MEDICARE

## 2021-07-05 NOTE — Progress Notes
Subjective:       History of Present Illness  Daniel Braun is a 75 y.o. male.    Initial Visit Date: 11/14 @ 9am  ?  Reason for Visit: Weakness [R53.1] Vision loss [H54.7]   11.10 LVM, UNABLE TO SEND MYCHART MSG  ?  Physician Information  PCP: Erskine Emery  Referring Physician: Francine Graven, MD  Previous Neurologist:   Previous Cardiologist: Francine Graven, MD  Other providers seen:   ?  Previous Imaging/Procedures:  9.30.22 2D + DOPPLER ECHO -   ? The left ventricular size, wall thickness and systolic function are normal. The visually estimated ejection fraction is 60%. There are no segmental wall motion abnormalities  ? The right ventricular size is normal. The right ventricular systolic function is normal.  ? Normal biatiral size.  ? No hemodynamically significant valvular disease.  ? No pericardial effusion.  ?  10.20.22 [facility] TREADMILL STRESS TEST  ? Symptoms with Exercise: shortness of breath  ? Fair exercise capacity. ?Baseline findings for hypertension.  ? Normal blood pressure response with exercise.  ? No stress induced ectopy or dysrhythmias.   ? Exercise stress ECG is equivocal for ischemia.  ?  ED/Hospitalizations:    ?  Records requested from:    Right leg weakness, loss of vision   This occurred on the 1st of September. He was getting up from the sofa when he noticed that he was unable to stand on his leg. This last about 15 minutes. There was no numbness, bowel or bladder control issues.     There is another episode of pain in his left shoulder that traveled down in left arm a few weeks later. He reports not being able to move it due to pain for 4-5 hours. There was no numbness.      05/20/21, [place] and where developed bilateral vision blurring. He went to the ED in [place] after this where he was admitted and underwent carotid dopplers, MRI Head and echocardiogram.     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 Does not know his family well.     Relevant PMHx:  1. 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 Non-obstructive CAD   3. HLD  4. Prior cluster headaches requiring oxygen   5. Multiple electrocutions, up to 10 reported per patient     Prior Workup:   1. [place] LHC 06/2021: nonobstructive coronary artery disease   2. [place] Echo 04/2021: EF 60%, no wall motion abnormalities, normal chamber sizes and valves   3. Outside Carotid US: 50-60% stenosis of proximal right ICA   4. Outside MRI Head 04/2021: no acute changes, mild global atrophy, left frontal bone 7 mm focus of enhancement favored to be benign with recommending follow up imaging   5. Outside MRA Head 04/2021: normal   __________________________________________________________________________________    Medical History:   Diagnosis Date   ? Carotid arterial disease [facility])      Surgical History:   Procedure Laterality Date   ? ANGIOGRAPHY CORONARY ARTERY WITH LEFT HEART CATHETERIZATION N/A 07/01/2021    Performed by Roderic Palau, MD at [facility] CATH LAB   ? POSSIBLE PERCUTANEOUS CORONARY STENT PLACEMENT WITH ANGIOPLASTY N/A 07/01/2021    Performed by Roderic Palau, MD at [facility] CATH LAB   ? HX CATARACT REMOVAL  5.3.16 & 5.17.16     Social History     Tobacco Use   ? Smoking status: Current Every Day Smoker     Packs/day: 0.50     Years: 15.00     Pack years: 7.50     Types: Cigarettes   ? Smokeless tobacco: Former Estate agent Use Topics   ? Drug use: Never     Family History   Problem Relation Age of Onset   ? None Reported Mother    ? None Reported Father    ? Cancer Sister    ? Premature Heart Disease Sister      No Known Allergies    Review of Systems  ROS negative unless otherwise specified in HPI    Objective:         ? aspirin EC 81 mg tablet Take 81 mg by mouth daily. Take with food.   ? atorvastatin (LIPITOR) 40 mg tablet Take two tablets by mouth daily. (Patient taking differently: Take 40 mg by mouth twice daily.)   ? nitroglycerin (NITROSTAT) 0.4 mg tablet Place one tablet under tongue every 5 minutes as needed for Chest Pain.     Vitals:    07/06/21 0831   BP: (!) 143/80   BP Source: Arm, Left Upper   Pulse: 68   SpO2: 98%   PainSc: Zero   Weight: 79.4 kg (175 lb)   Height: 177.8 cm (5' 10)     Body mass index is 25.11 kg/m?Marland Kitchen     General: alert, oriented x 3  Speech: normal, no dysarthria  Cardiovascular: regular rate and rhythm  Lungs: resting comfortably on RA   Ext: no leg edema  Cranial nerves: ophthalmoscopic exam: no papilledema or optic pallor; II Visual fields full to finger counting; III, IV, VI PERRL, extraocular muscles intact, no nystagmus; V facial sensation intact; VII facial expression symmetric; VIII hearing intact to conversation; IX, X palate rise symmetric; XI sternocleidomastoid strength intact; XII tongue midline, no fasciculations present  Motor: (Right/Left)  Deltoid 5/5, Biceps 5/5, Triceps 5/5, WF/WE 5/5, Finger ext 5/4, interossei 5/4, Hip Flexion 5/5, Knee ext 5/5, Knee flex 5/5, Ankle dorsiflexion 5/5, Ankle plantarflexion 5/5, distal LE atrophy bilaterally   Sensory: Normal to light touch. Proprioception intact at toes. Reduced vibration sense to 1-2 seconds in MTP bilaterally   Coordination: normal finger nose finger and heel to shin smooth without ataxia  Reflexes: (Right/Left) Biceps 1/1, Triceps 1/1, Brachioradialis 1/1, Knee Jerks 1/1, Ankle Jerks 1/1, Toes downgoing, no clonus   Abnormal Movements: no tremors, no rigidity, no bradykinesia  Gait: normal based, good arm swing, Toe/heel walking without assistance, mild tandem gait intact difficulty     ASSESSMENT/PLAN:  CLINICAL SUMMARY      Daniel Braun 75 y.o. male with notable history of right ICA stenosis, is here today for evaluation of multiple neurologic complaints.    He reports first developing an episode of right leg weakness described as inability to put his foot down for 30 minutes upon attempting to stand from a sitting position two months ago. This has not recurred before or since and was not associated with any other neurologic symptoms. He then experienced severe left shoulder pains a few weeks later lasting 4-5 hours without associated neurologic symptoms. Then on 05/20/21 he experienced blurring of his vision transiently while driving. This has not recurred. She has seen his Ophthalmologist in [place] who told him his examination was unremarkable. He has had no further issues since  05/20/21. He was admitted to a local hospital at that time where he was found to have moderate right ICA stenosis, and + stress test. MRI/MRA Head at that time references no acute changes but an area of enhancement in the left frontal bone region, I am not able to review these images at this time but they have been requested. His examination in clinic today is notable for mild left hand weakness and distal symmetric sensation loss of vibration in the LE without significant weakness or hyperreflexia. I do think there is some muscle atrophy distally in his legs as well. In the interim, he has undergone Cardiology evaluation with stress and LHC with non-obstructive CAD detected.     We discussed next steps and possible causes. I am unable to localize his complaints to one region of the nervous system given multiple location involved and different complaints in each region. It is possible that a peripheral process such as neuropathy could cause vision changes, weakness and pain. Will proceed with neuropathy workup per below. This is non-painful. He has also complained of intermittent neck pains and left hand weakness. Will proceed with MRI C spine. He does not show any other signs to suggest myelopathy. He has worked at a [employer] his whole life and reported being electrocuted 10x during his career so heavy metals and electrocution damage are also a possibility.     Impression:  1. 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 Left hand weakness    4. Atherosclerosis of artery    5. Atherosclerosis of right carotid artery    6. Other disorders of peripheral nervous system       RECOMMENDATIONS  1. Neuropathy workup: B12, CRP, ESR, [place], MMA, A1c, SPEP, SIFE, urinary heavy metals, RPR  2. EMG/NCS of the LUE/LLE   3. MRI C Spine   4. Continue aspirin 81 mg daily for secondary stroke prevention   5. LDL goal < 70   6. BP goal < 140/90   7. A1c goal < 7       MANAGEMENT AND PLAN  During the visit I discussed my impression, recommended diagnostic studies, prognosis, risks and benefits of management, instructions for management, and importance of compliance.  After a discussion, the patient agrees with the plan.     Total Time Today was 60 minutes in the following activities: Preparing to see the patient, Obtaining and/or reviewing separately obtained history, Performing a medically appropriate examination and/or evaluation, Counseling and educating the patient/family/caregiver and Ordering medications, tests, or procedures    FOLLOWUP PLAN  Return in about 6 months (around 01/03/2022).  The patient is instructed to contact me if there are any concerns with the agreed plan.    Barb Merino, MD  Clinical Instructor  Department of Neurology  [facility]

## 2021-07-06 ENCOUNTER — Encounter: Admit: 2021-07-06 | Discharge: 2021-07-06 | Payer: MEDICARE

## 2021-07-06 ENCOUNTER — Ambulatory Visit: Admit: 2021-07-06 | Discharge: 2021-07-06 | Payer: MEDICARE

## 2021-07-06 DIAGNOSIS — E74818 Other disorders of glucose transport (HCC): Secondary | ICD-10-CM

## 2021-07-06 DIAGNOSIS — R531 Weakness: Secondary | ICD-10-CM

## 2021-07-06 DIAGNOSIS — I779 Disorder of arteries and arterioles, unspecified: Secondary | ICD-10-CM

## 2021-07-06 DIAGNOSIS — G64 Other disorders of peripheral nervous system: Secondary | ICD-10-CM

## 2021-07-06 DIAGNOSIS — H547 Unspecified visual loss: Secondary | ICD-10-CM

## 2021-07-06 DIAGNOSIS — G629 Polyneuropathy, unspecified: Secondary | ICD-10-CM

## 2021-07-06 DIAGNOSIS — I7090 Unspecified atherosclerosis: Secondary | ICD-10-CM

## 2021-07-06 DIAGNOSIS — I6521 Occlusion and stenosis of right carotid artery: Secondary | ICD-10-CM

## 2021-07-06 DIAGNOSIS — R29898 Other symptoms and signs involving the musculoskeletal system: Secondary | ICD-10-CM

## 2021-07-06 LAB — VITAMIN B12: VITAMIN B12: 303 pg/mL (ref 180–914)

## 2021-07-06 LAB — SYPHILIS AB SCREEN: SYPHILIS AB, TOTAL: NEGATIVE

## 2021-07-06 LAB — HEMOGLOBIN A1C: HEMOGLOBIN A1C: 5.4 % (ref 4.0–6.0)

## 2021-07-06 LAB — SED RATE: ESR: 8 mm/h (ref 0–20)

## 2021-07-06 LAB — C REACTIVE PROTEIN (CRP): C-REACTIVE PROTEIN: 0.2 mg/dL (ref <1.0)

## 2021-07-07 ENCOUNTER — Encounter: Admit: 2021-07-07 | Discharge: 2021-07-07 | Payer: MEDICARE

## 2021-07-07 NOTE — Telephone Encounter
-----   Message from Marty Heck, MD sent at 07/06/2021  5:20 PM CST -----  Could you let them know I was able to review his prior MRI, MRA and CT scan of his head. There are no concerning findings to explain his symptoms seen on this imaging. Will continue plan as discussed in clinic visit.   ----- Message -----  From: Monna Fam, [place]  Sent: 07/06/2021   1:52 PM CST  To: Marty Heck, MD    [facility]

## 2021-07-07 NOTE — Telephone Encounter
Informed pt and Scarlette Calico of the result and recommendation.

## 2021-07-09 ENCOUNTER — Encounter: Admit: 2021-07-09 | Discharge: 2021-07-09 | Payer: MEDICARE

## 2021-07-09 DIAGNOSIS — G629 Polyneuropathy, unspecified: Secondary | ICD-10-CM

## 2021-07-09 NOTE — Progress Notes
Error.

## 2021-07-14 ENCOUNTER — Encounter: Admit: 2021-07-14 | Discharge: 2021-07-14 | Payer: MEDICARE

## 2021-08-28 ENCOUNTER — Ambulatory Visit: Admit: 2021-08-28 | Discharge: 2021-08-28 | Payer: MEDICARE

## 2021-08-28 ENCOUNTER — Encounter: Admit: 2021-08-28 | Discharge: 2021-08-28 | Payer: MEDICARE

## 2021-08-28 DIAGNOSIS — R29898 Other symptoms and signs involving the musculoskeletal system: Secondary | ICD-10-CM

## 2021-08-28 DIAGNOSIS — I779 Disorder of arteries and arterioles, unspecified: Secondary | ICD-10-CM

## 2021-09-28 IMAGING — CR [ID]
3 series · 3 of 3 positions shown · non-contrast
Comparison: 05/21/2021.

DIAGNOSTIC STUDIES

EXAM:  XR CHEST, 2 VIEWS  ([id number])
INDICATION: cough/congestion c/o cough for 1 week , denies chest pain , soa , fever
TECHNIQUE: Frontal and lateral views of the chest.

[chest pa (1 of 2)]
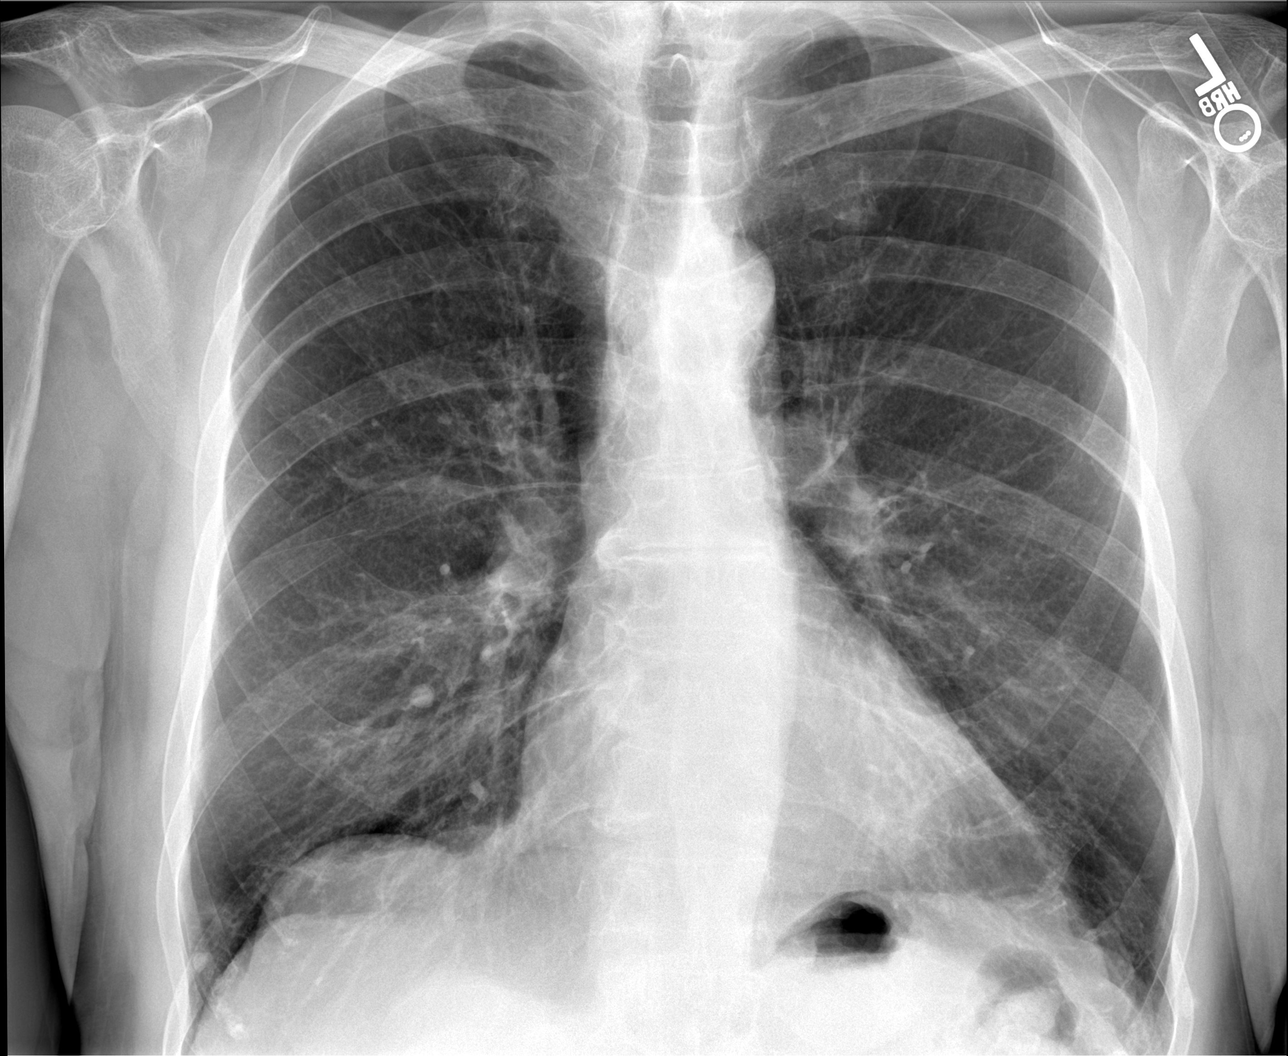

[chest pa (2 of 2)]
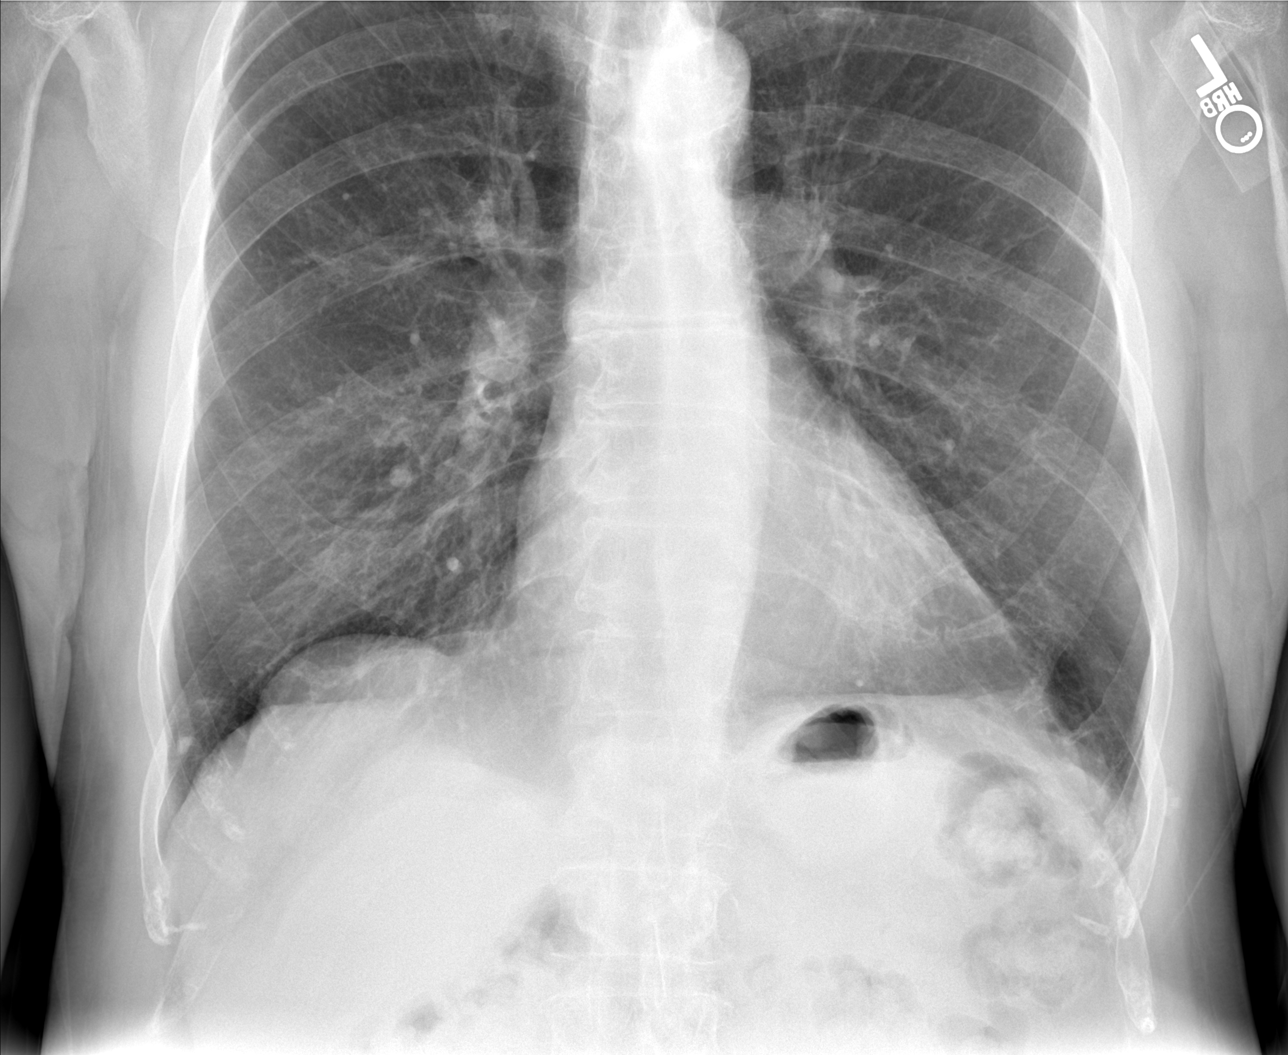

[chest lat]
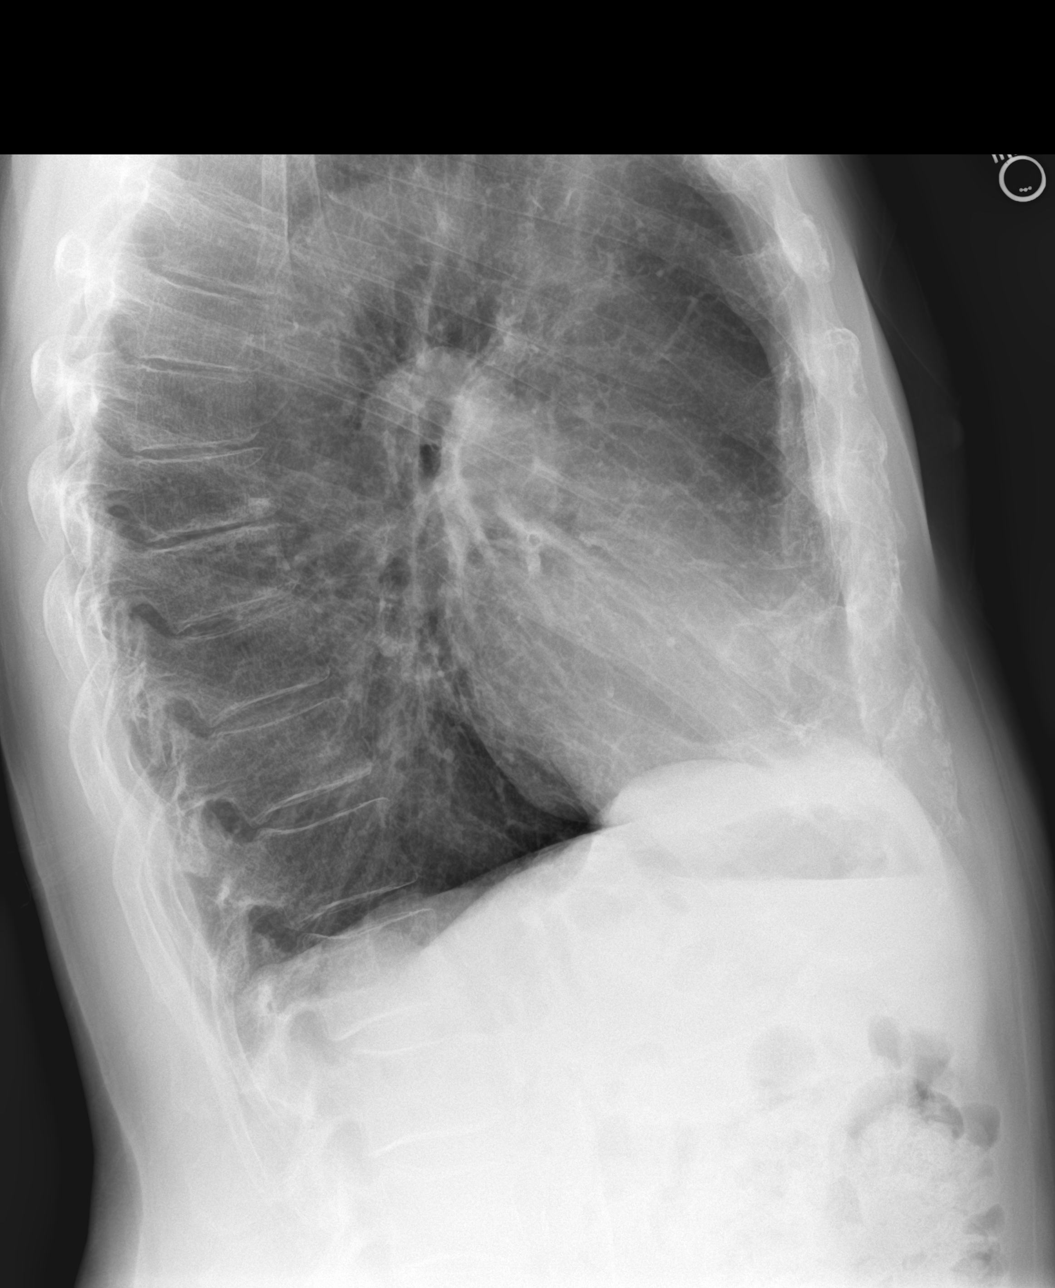

[3 of 3 positions shown; findings below may reference images not displayed]

FINDINGS: LUNGS:  Bilateral lung densities/calcifications most consistent with granulomas RIGHT greater than
LEFT.

Moderate hyperexpansion of the lungs. Minimal basilar bronchovascular crowding.

The lungs demonstrate NO consolidations or significant effusions.

HEART:  The heart is normal in size and position.

BONES/JOINTS:  BONES minimal midthoracic chronic appearing wedge-shaped spinal deformities.

Minimal to mild osteopenia of the visualized bony structures.

VASCULATURE:  Minimal arterial calcification.
IMPRESSION: NO consolidative pneumonia, significant atelectasis or effusion.

Tech Notes:

c/o cough for 1 week , denies chest pain , soa , fever

## 2021-10-01 IMAGING — CR [ID]
1 series · 1 of 1 positions shown · non-contrast
Comparison: none

[x chest ap]
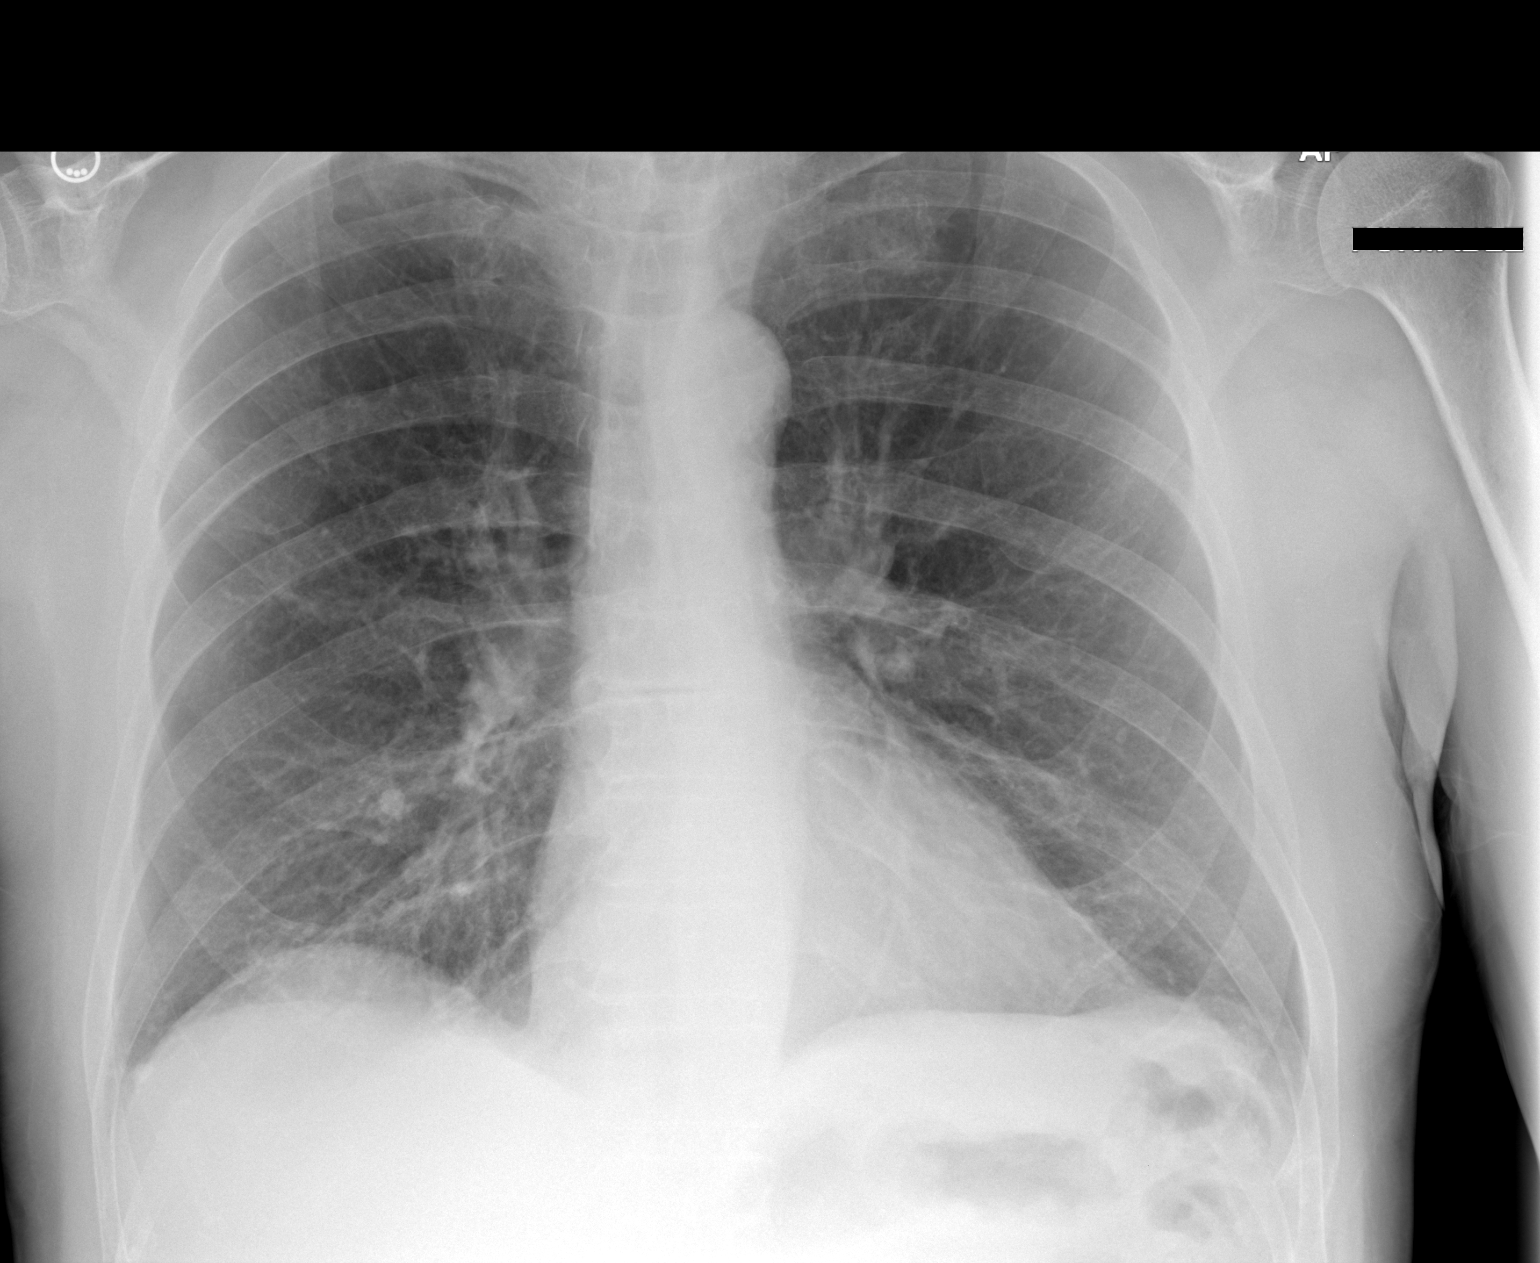

[1 of 1 positions shown; findings below may reference images not displayed]

EXAM

RADIOLOGICAL EXAMINATION, CHEST; SINGLE VIEW, FRONTAL CPT 52323

INDICATION

cough
congestion, cough , can't sleep due to cough.CS

TECHNIQUE

Single PA portable view of the chest was performed.

COMPARISONS

09/28/2021

FINDINGS

The heart appears normal in size. Mild bilateral hilar prominence. Mild peribronchial thickening.
No gross pneumothorax. No dense consolidation. No acute fracture. Limited evaluation of the thoracic
spine.

IMPRESSION

1. Mild peribronchial thickening. No dense consolidation.

Tech Notes:

congestion, cough , can't sleep due to cough.CS

## 2021-11-21 IMAGING — CR WRISTCMRT
2 series · 2 of 2 positions shown · non-contrast
Comparison: none

[x wrist pa right]
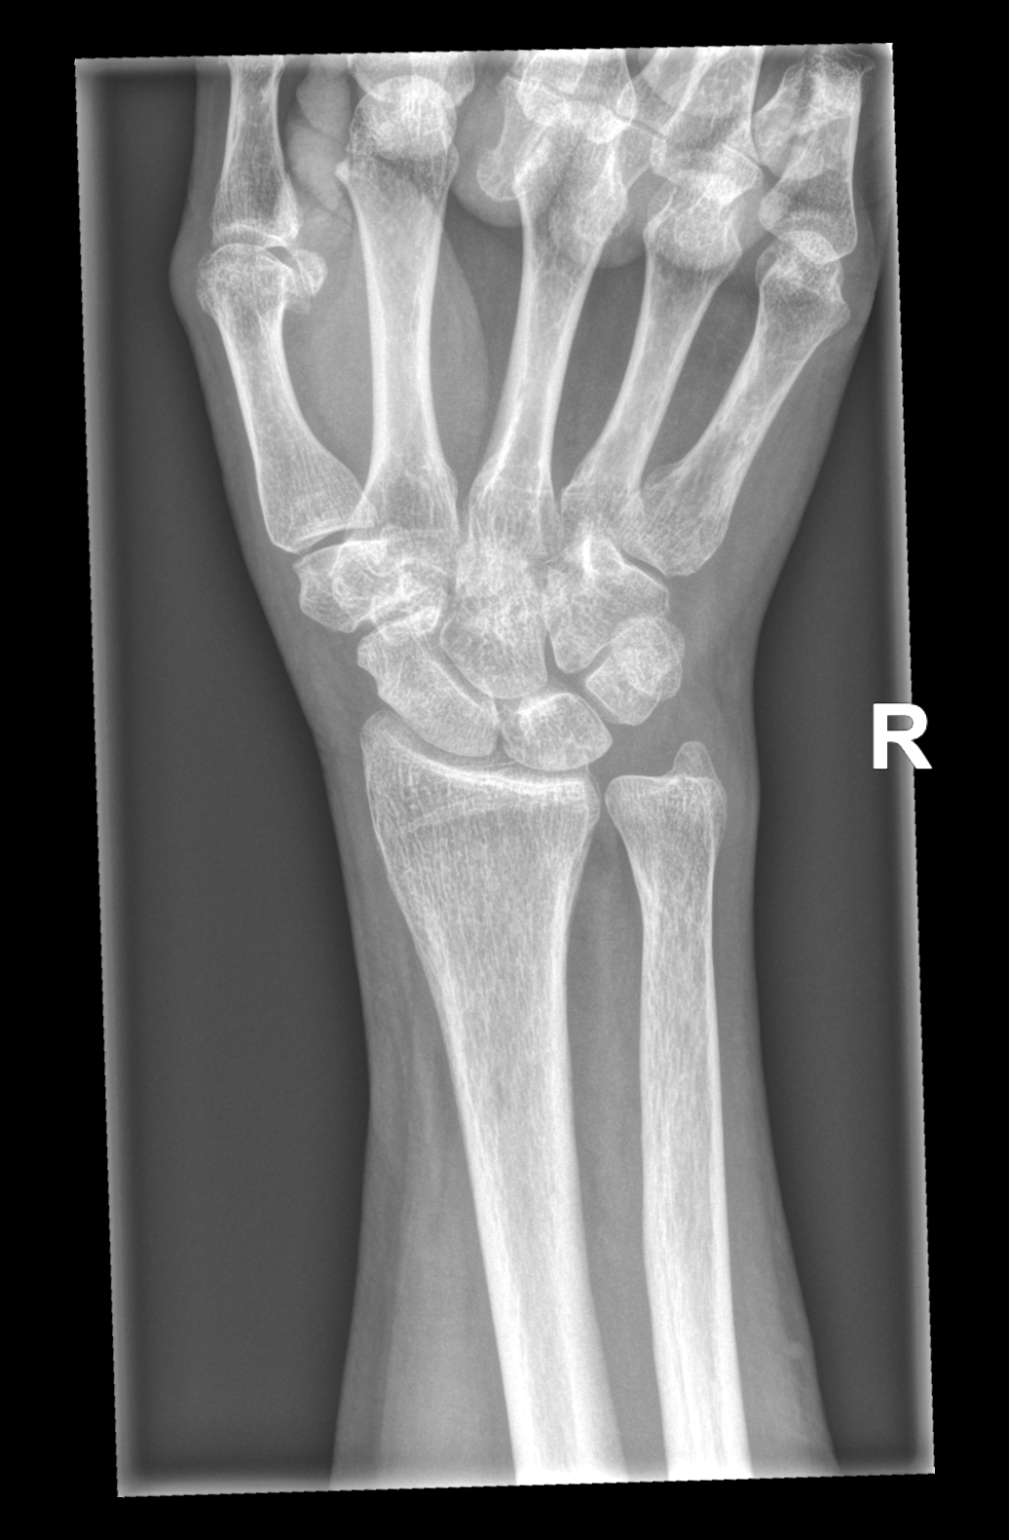

[x wrist obl right]
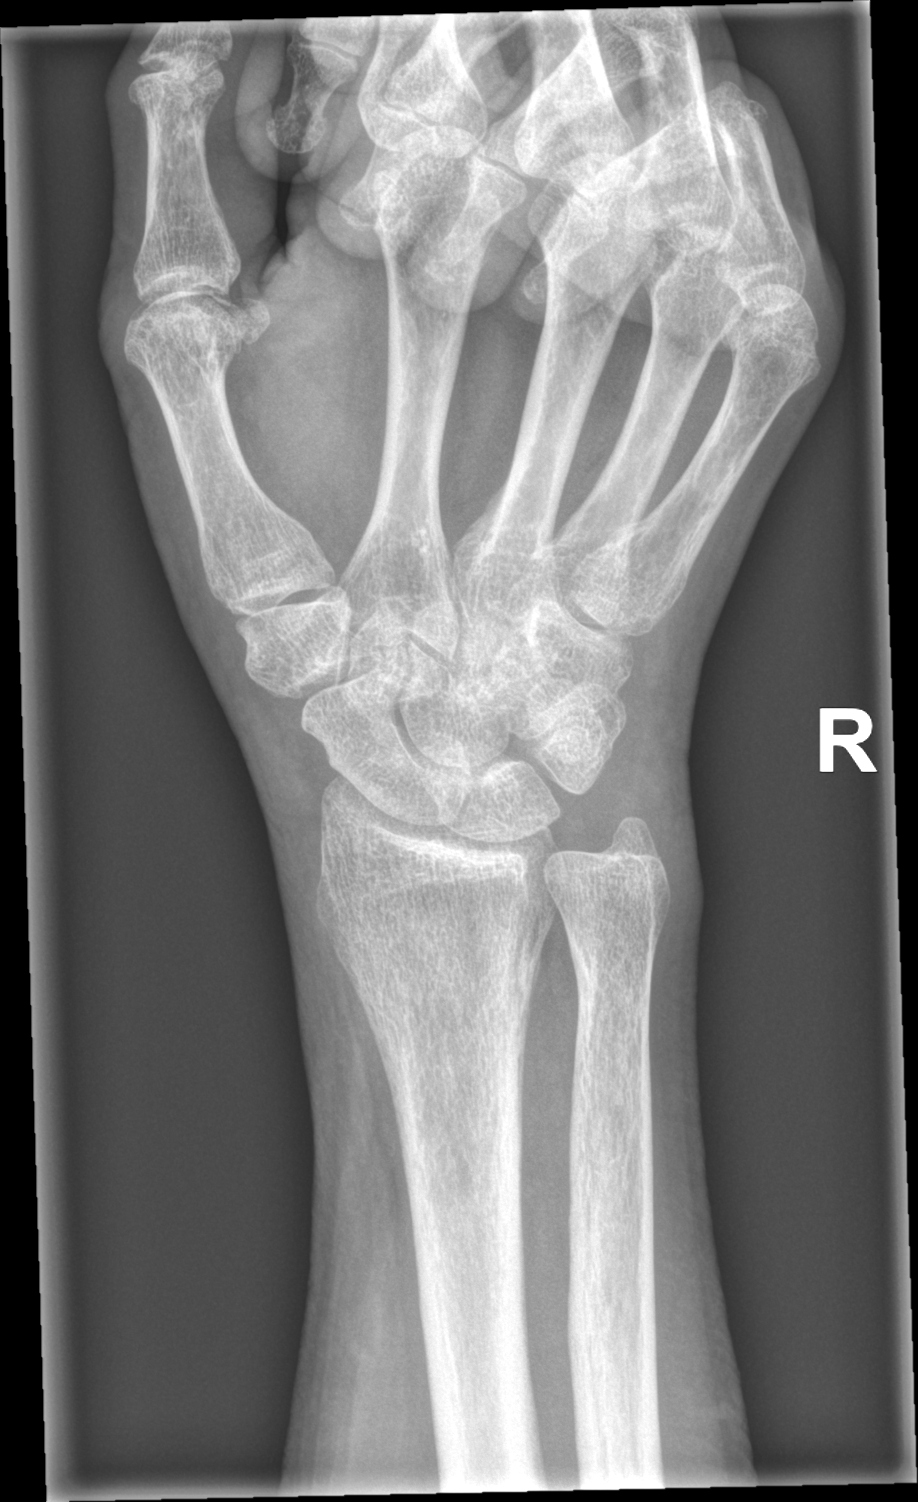

[2 of 2 positions shown; findings below may reference images not displayed]

EXAM

Right wrist

INDICATION

fell
FOOSH, rt wrist pain. AK

FINDINGS

Three views of the right wrist were obtained.

There is evidence of a chip fracture involving the posterior margin of the triquetrum, seen on the
lateral view. There is soft tissue swelling anteriorly and posteriorly.

IMPRESSION

There is a small bone fragment along the posterior margin of the carpal bones on the lateral view,
most likely representing a triquetrum fracture. There is overlying soft tissue swelling.

Tech Notes:

FOOSH, rt wrist pain. AK

## 2022-01-02 IMAGING — CR [ID]
5 series · 5 of 5 positions shown · non-contrast
Comparison: none

[lspine ap]
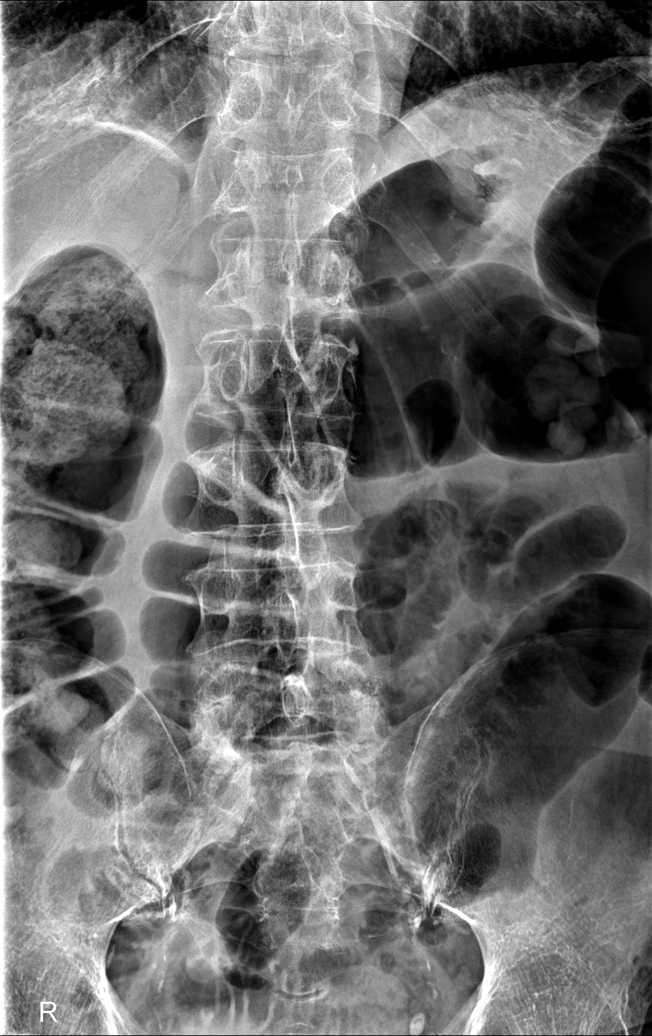

[lspine obl (1 of 2)]
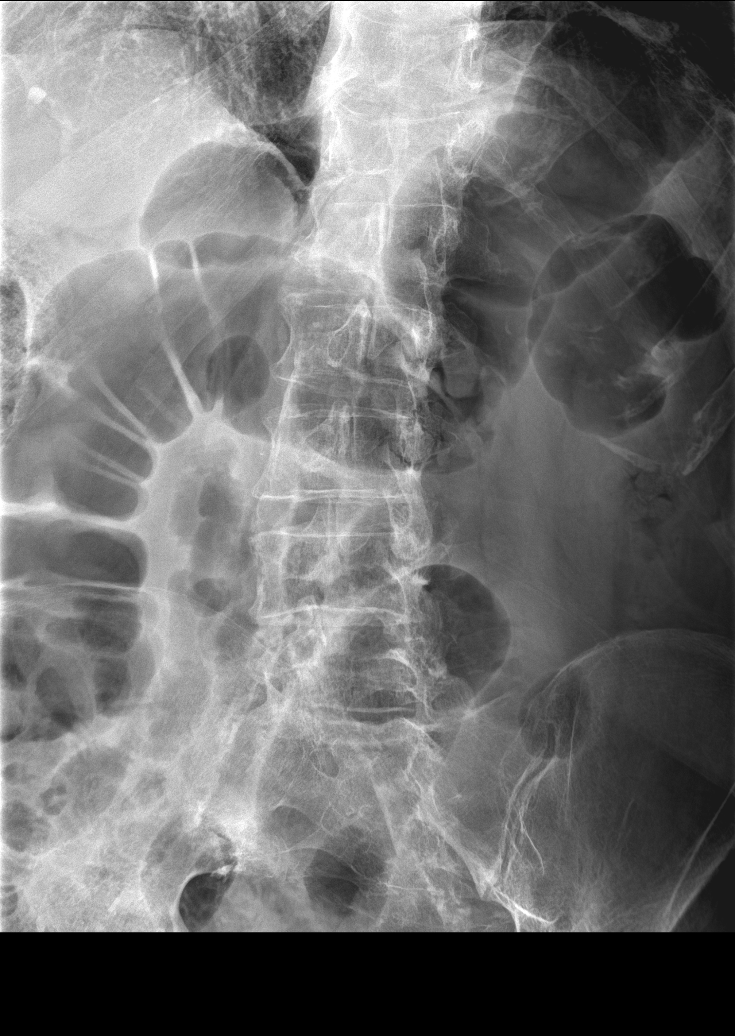

[lspine obl (2 of 2)]
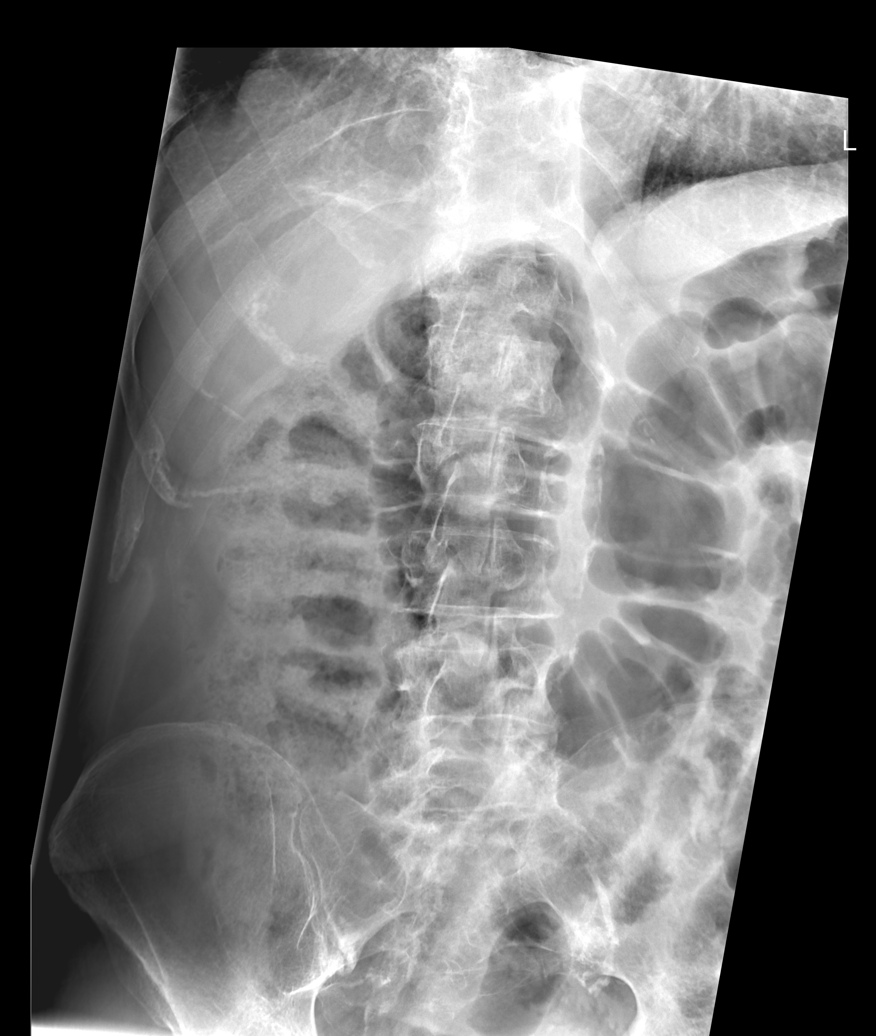

[lspine lat]
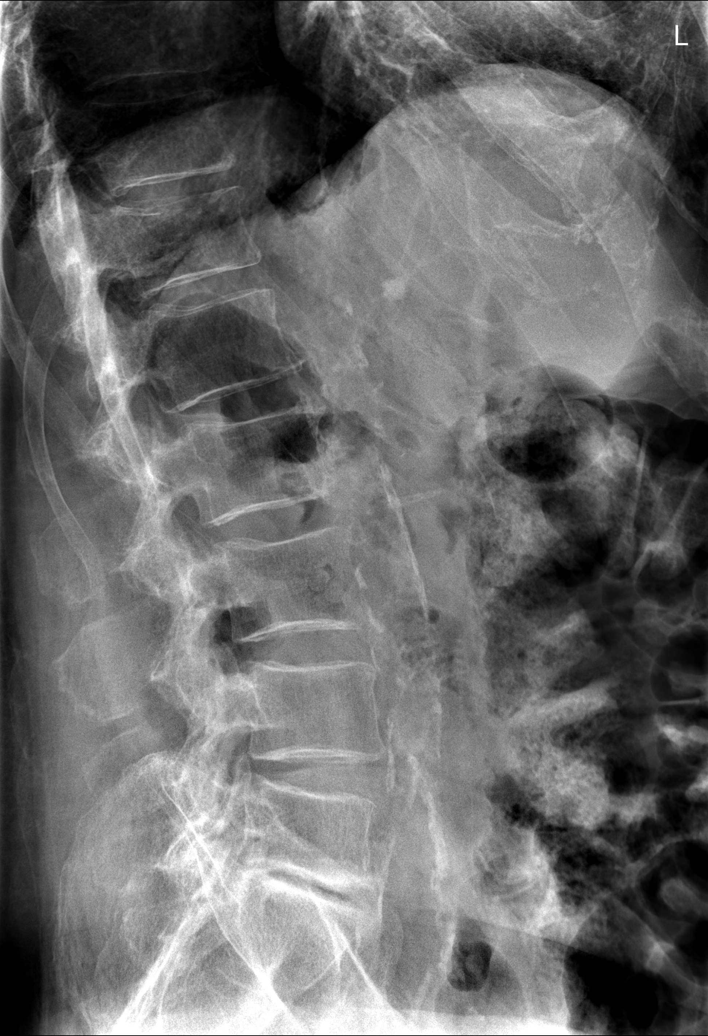

[lspine l5-s1]
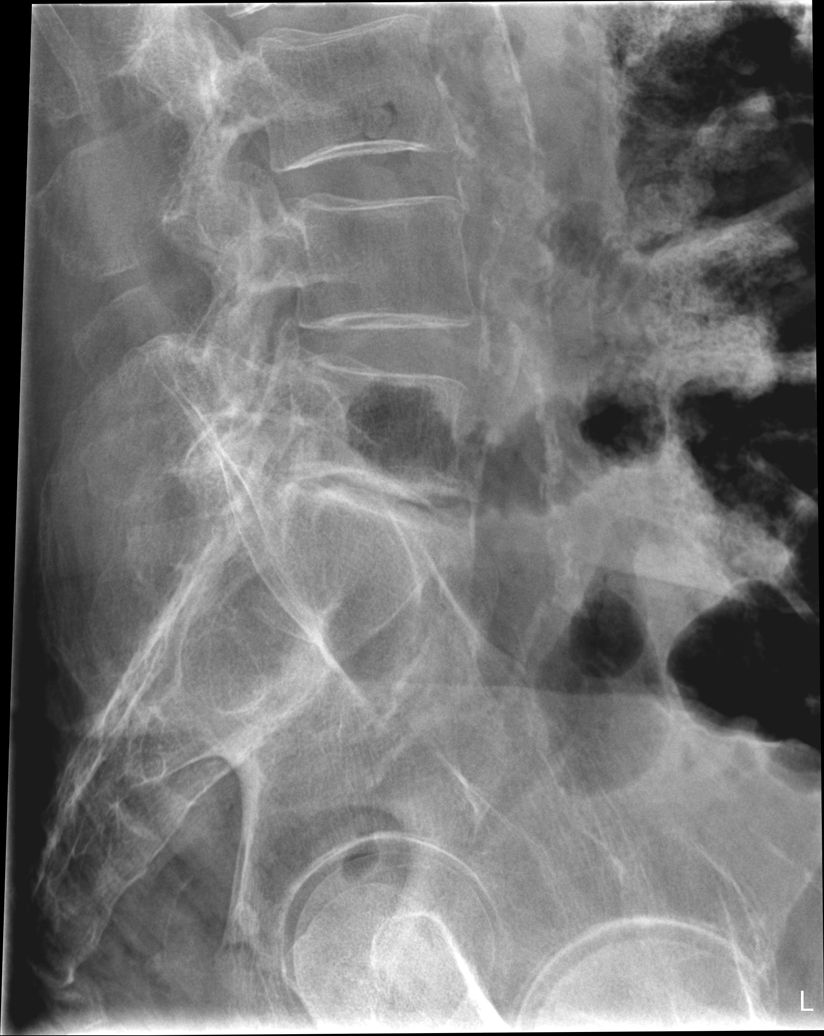

[5 of 5 positions shown; findings below may reference images not displayed]

EXAM

Lumbar spine series

INDICATION

back pain
c/o back and abd pain. constipation.   hb

FINDINGS

Five views of the lumbar spine were obtained, including obliques.

Lumbar vertebral body heights and alignment are normal. There is a mild compression fracture of the
superior endplate of the T12 vertebral body with loss of less than 10 percent of vertebral body
height. This is of undetermined age.

There is moderate disc space narrowing at L5-S1 with a vacuum disc.

There is narrowing of the facet joint spaces bilaterally, especially at L5-S1.

There is osteopenia.

IMPRESSION

There is a mild compression fracture of the superior endplate of the T12 vertebral body of
undetermined age with loss of less than 10 percent of vertebral body height and no retropulsion.

There is lumbar spine spondylosis most notably involving the lower lumbar levels, especially L5-S1.

Tech Notes:

c/o back and abd pain. constipation.
hb

## 2022-01-02 IMAGING — CT ABDOMEN_PELVIS W(Adult)
2 of 3 series · 12 of 46 positions shown, 14 images · non-contrast
Comparison: none

[Series 2: abdomen_pelvis ax 3.00 br40 s3 · axial · 0.57mm/px · z∈[+1434,+1812]mm · 9 of 145 slices shown, 11 images]
[im 10/145  soft-tissue]
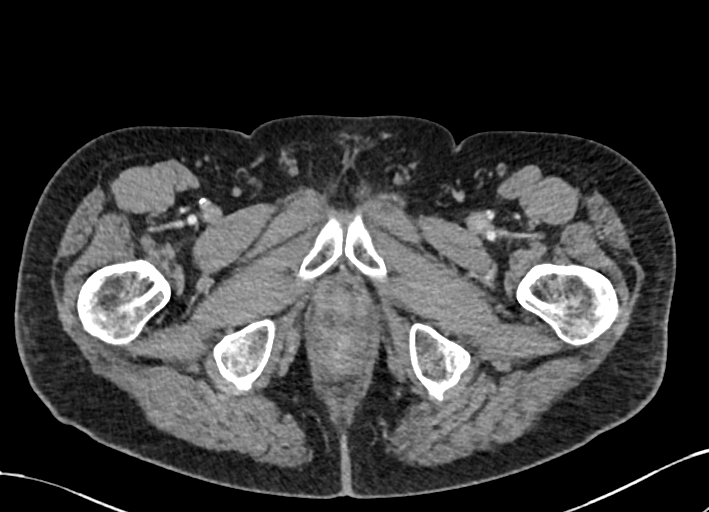
[im 10/145  bone]
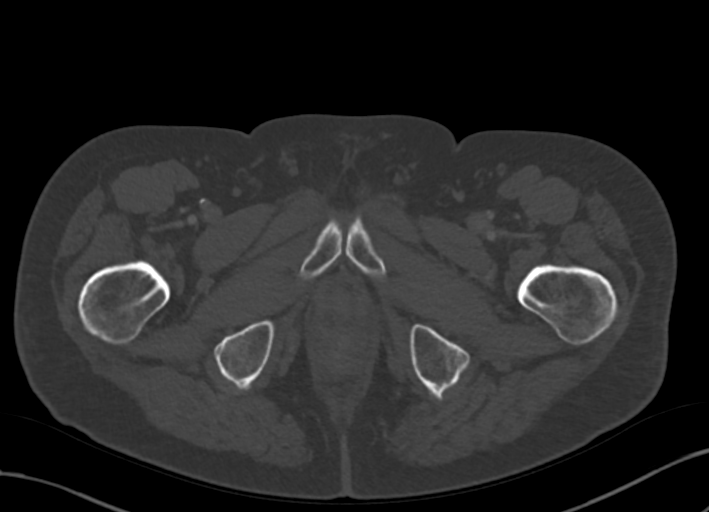
[im 28/145  soft-tissue]
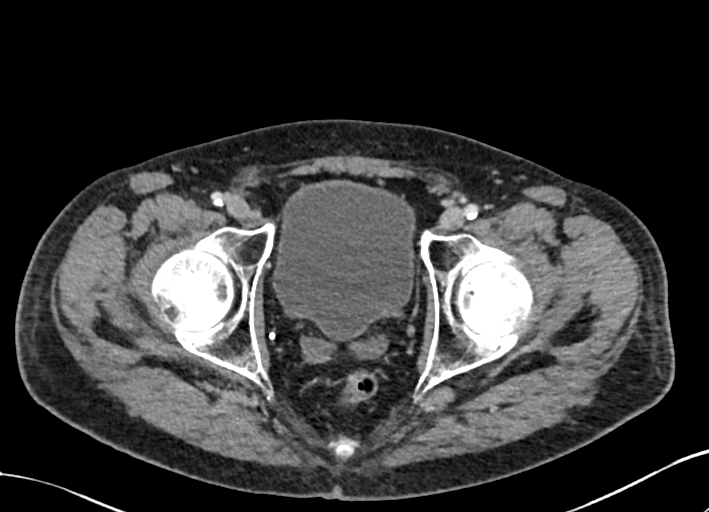
[im 42/145  soft-tissue]
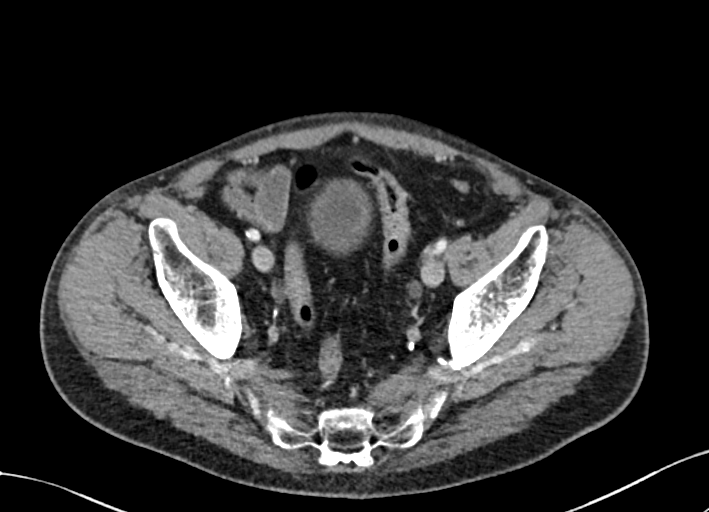
[im 56/145  soft-tissue]
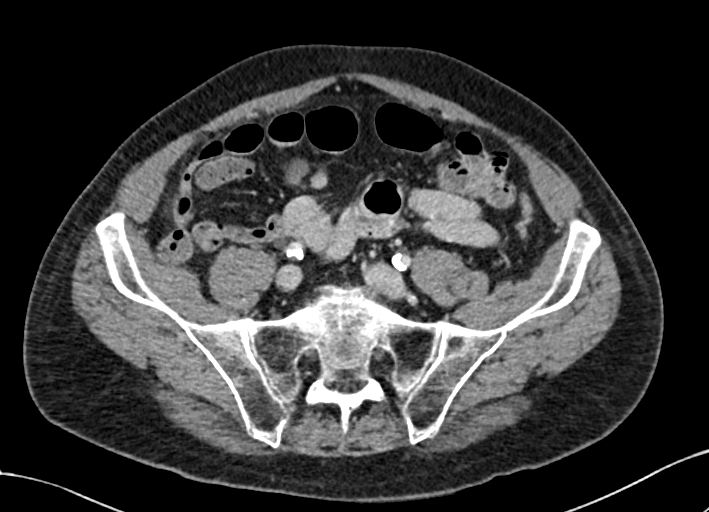
[im 75/145  soft-tissue]
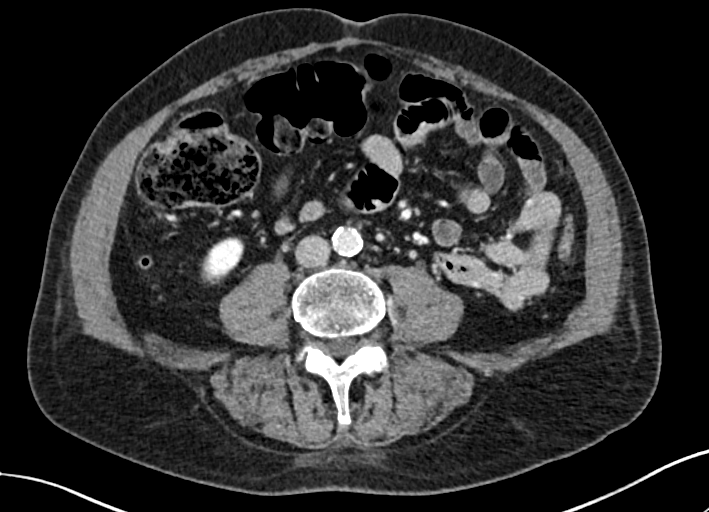
[im 89/145  soft-tissue]
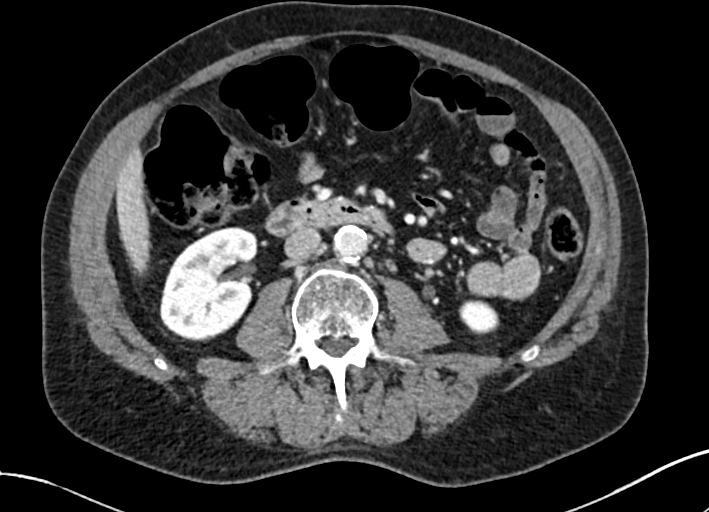
[im 103/145  soft-tissue]
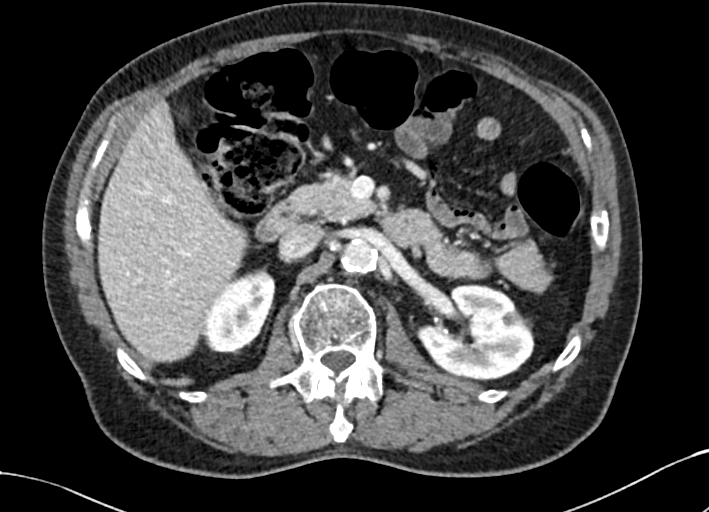
[im 121/145  soft-tissue]
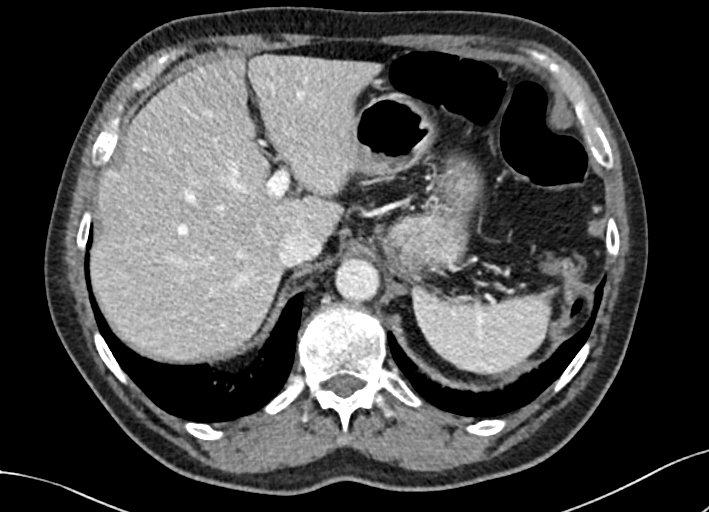
[im 135/145  soft-tissue]
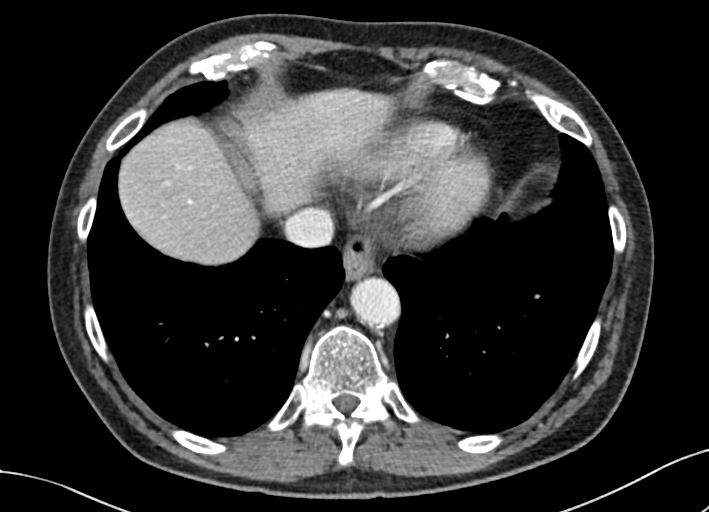
[im 135/145  bone]
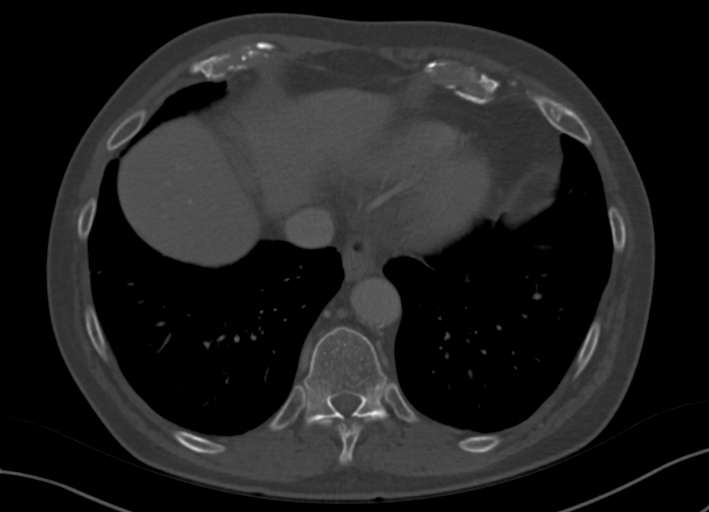

[Series 4: abdomen_pelvis cor 3.00 br40 s3 · coronal · 0.78mm/px · 3 of 95 slices shown]
[im 32/95  soft-tissue]
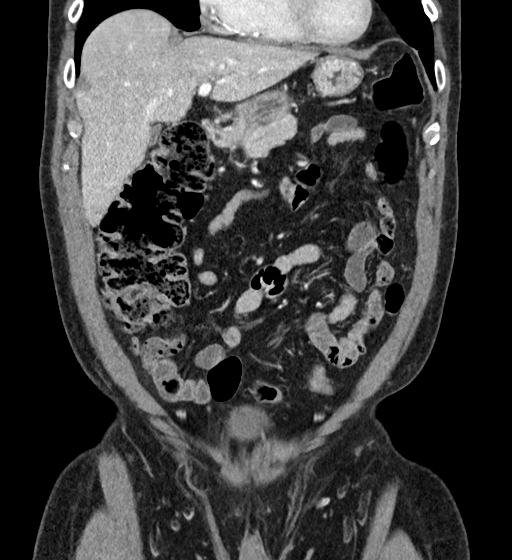
[im 42/95  soft-tissue]
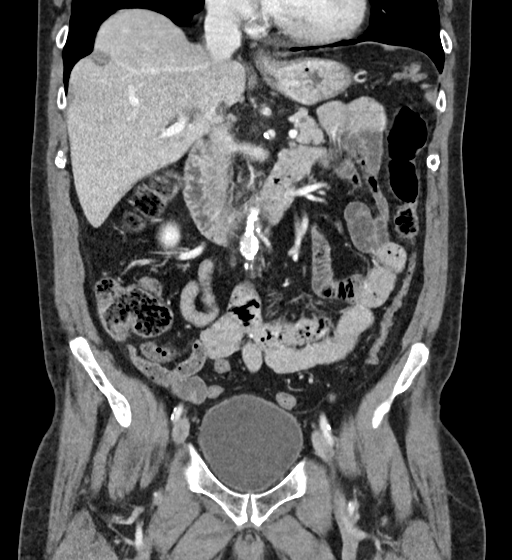
[im 53/95  soft-tissue]
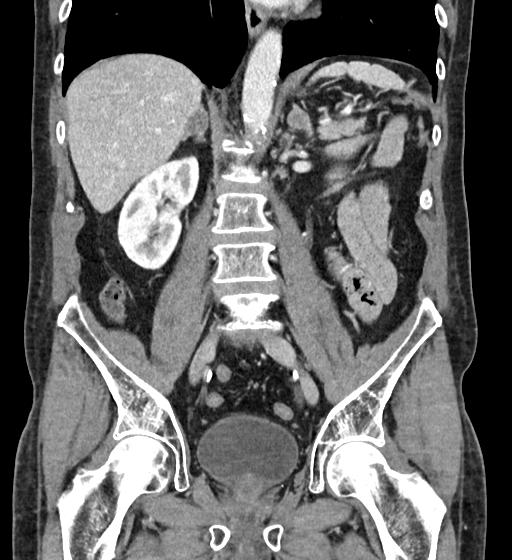

[12 of 46 positions shown; findings below may reference images not displayed]

EXAM

CT abdomen pelvis with contrast

INDICATION

abd pain
BACK AND ABD PAIN. CONSTIPATION. 5P2PS-B.Y0 HYS-7G.6. CONSENT OBTAINED PT GIVEN 100 CC LRPRZAA BY
IV.   HB

FINDINGS

CT of the abdomen and pelvis was performed after an IV dose of 100 mL of Omnipaque 300.

There are scattered bibasilar interstitial pulmonary opacities. There are small blebs in the right
lung base there is a calcified granuloma in the right lower lobe.

The liver and spleen appear normal. The gallbladder is partially contracted.

The pancreas appears normal. There is mild adrenal hypertrophy bilaterally.

The right kidney appears normal. There is a small left renal cortical cyst superiorly. There is no
hydronephrosis.

The appendix is normal.

There is a moderate amount a gas is stool in the ascending and transverse colon. There diverticula
within a nondistended splenic flexure.

There is no adenopathy. There is no obstruction.

There is calcified atherosclerotic plaque throughout the abdominal aorta, common iliac arteries and
mesenteric vessels.

Urinary bladder contour is normal.

There is no acute bone lesion.

IMPRESSION

There is a moderate amount a gas is stool in the ascending and transverse colon. There are
diverticula within the splenic flexure and proximal descending colon representing diverticulosis. No
inflammatory change is identified. No abscess or mass lesion is identified. There is no adenopathy.
The appendix appears normal.

Tech Notes:

BACK AND ABD PAIN. CONSTIPATION. 5P2PS-B.Y0 HYS-7G.6. CONSENT OBTAINED PT GIVEN 100 CC LRPRZAA BY
IV.
HB

## 2022-01-25 IMAGING — CT LOW_DOSE_LUNG(Adult)
2 of 9 series · 11 of 36 positions shown, 14 images · non-contrast
Comparison: none

[Series 10: lung cor 1.00 br44 s3 · coronal · 0.67mm/px · 1 of 280 slices shown]
[im 140/280  lung]
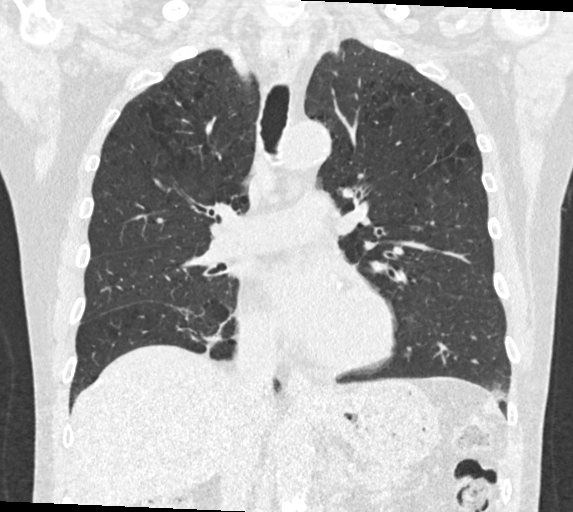

[Series 17: lung 1.00 br60 s3 cad · axial · 0.75mm/px · z∈[+1477,+1754]mm · 10 of 486 slices shown, 13 images]
[im 45/486  mediastinal]
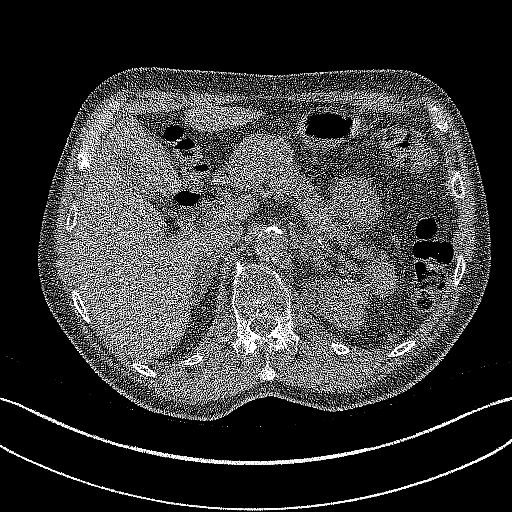
[im 45/486  lung]
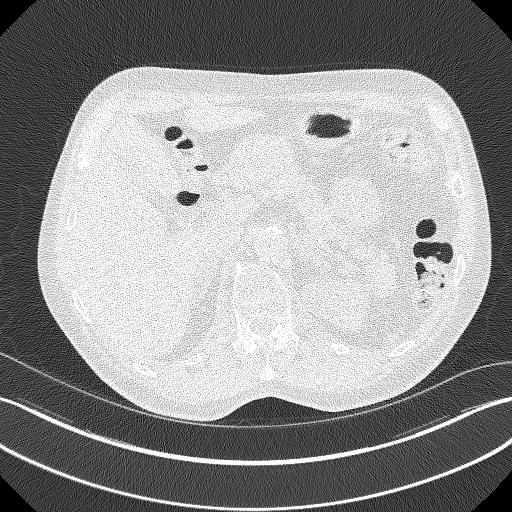
[im 89/486  lung]
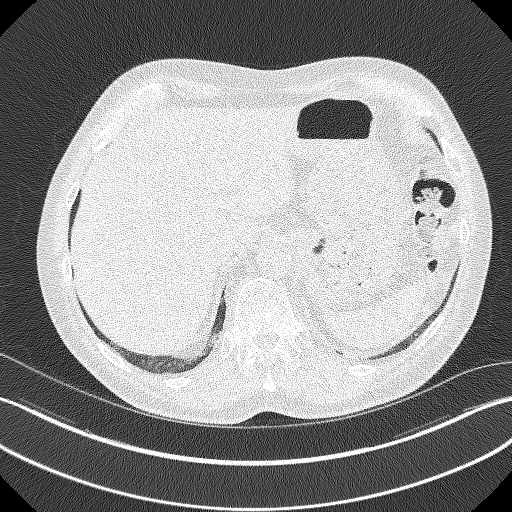
[im 133/486  lung]
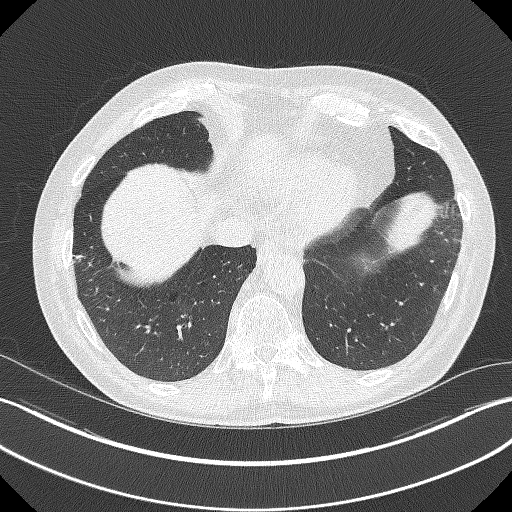
[im 177/486  lung]
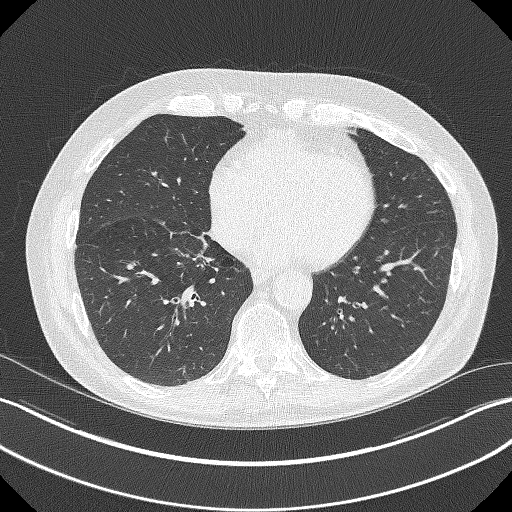
[im 221/486  mediastinal]
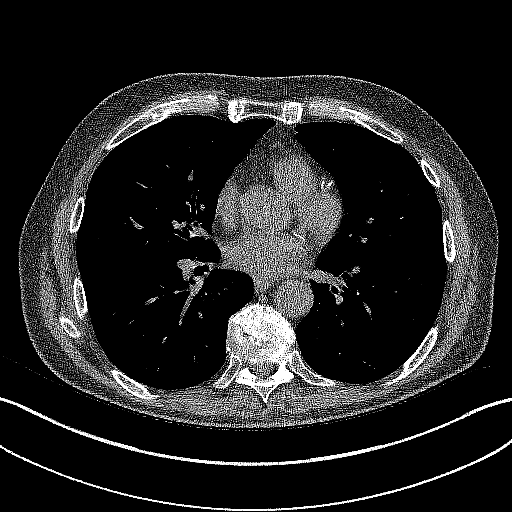
[im 221/486  lung]
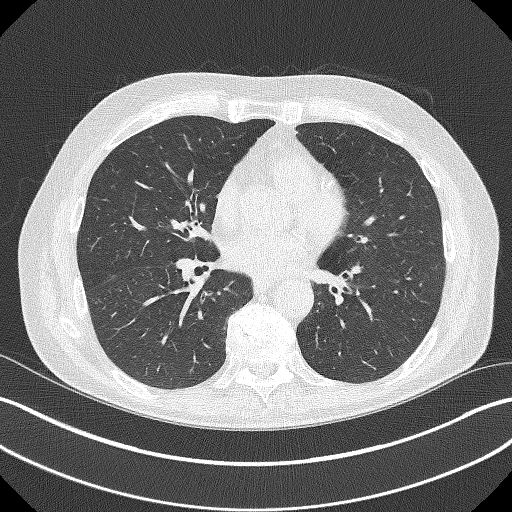
[im 265/486  lung]
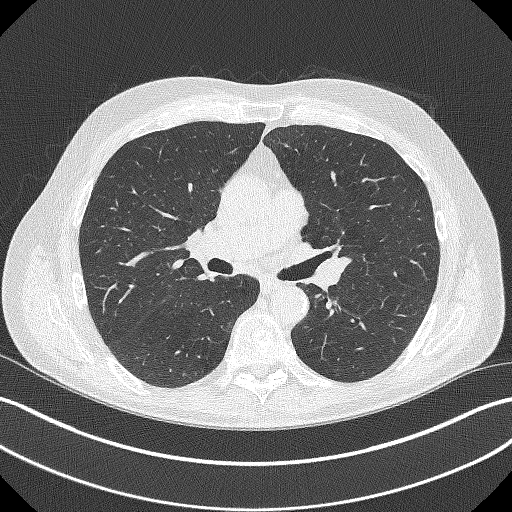
[im 309/486  lung]
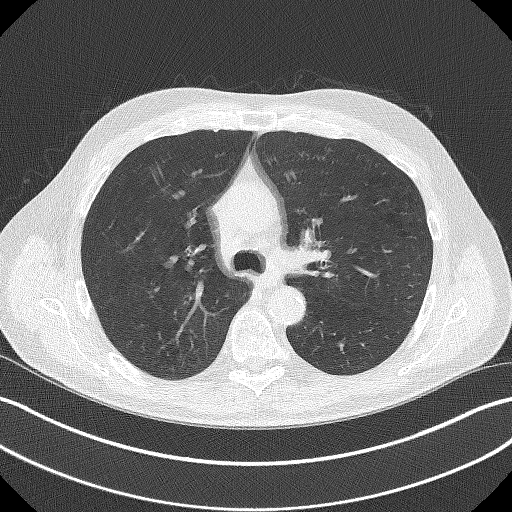
[im 353/486  lung]
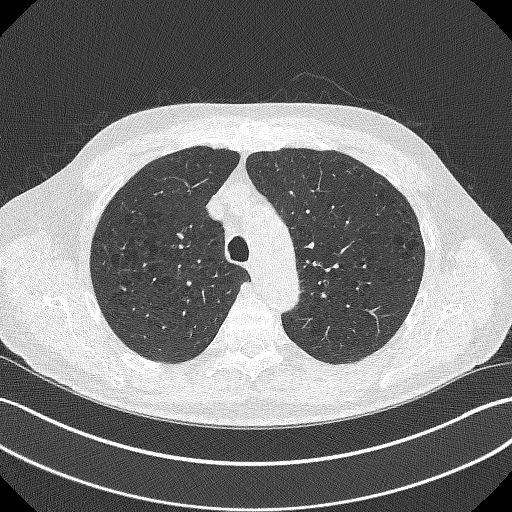
[im 397/486  mediastinal]
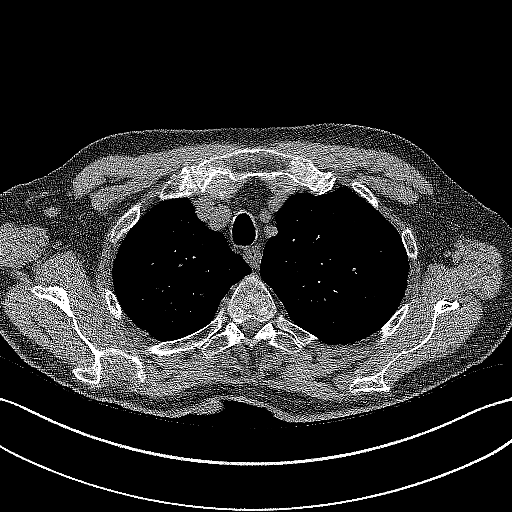
[im 397/486  lung]
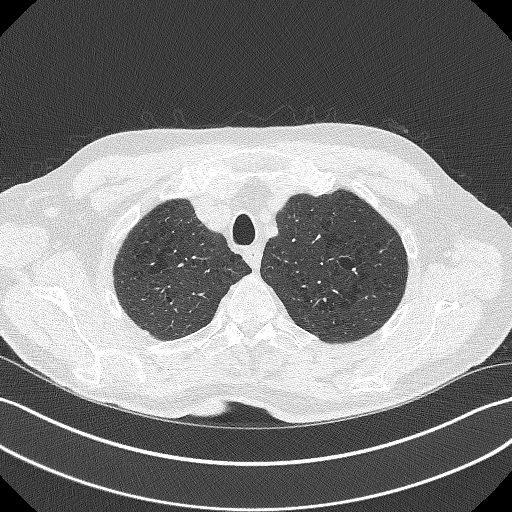
[im 441/486  lung]
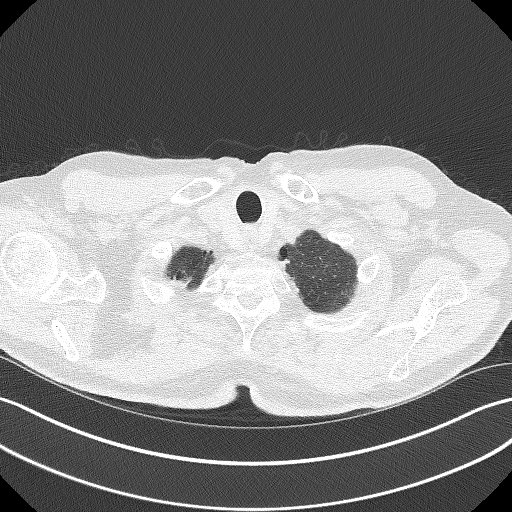

[11 of 36 positions shown; findings below may reference images not displayed]

DIAGNOSTIC STUDIES

EXAM

Low-dose CT lung screening

INDICATION

former smoker
PT STATES SMOKES 1 PACK PER DAY X 50 YEARS. CT/NM 1/0. TJ

TECHNIQUE

All CT scans at this facility use dose modulation, iterative reconstruction, and/or weight based
dosing when appropriate to reduce radiation dose to as low as reasonably achievable.

Number of previous computed tomography exams in the last 12 months is 1.

Number of previous nuclear medicine myocardial perfusion studies in the last 12 months is 0 .

COMPARISONS

None available

FINDINGS

Low-dose technique is limited in evaluation of mediastinal and subdiaphragmatic soft tissues.

Heart size is normal. There is dense calcification of the coronary vessels.

Underlying emphysematous changes are evident throughout both lungs with bronchial wall thickening.
Calcified granulomas are seen within the right lung base. Minimal mucous plugging is noted. Scarring
or atelectasis can be seen within the lung bases.

No concerning osseous lesions. Old T12 compression deformity is re-demonstrated.

IMPRESSION

Emphysematous changes and bronchial wall thickening with minimal mucous plugging. There is scarring
and atelectasis in the lung bases. Lung rads 2. Recommend repeat exam in 1 year.

Tech Notes:

PT STATES SMOKES 1 PACK PER DAY X 50 YEARS. CT/NM 1/0. TJ

## 2023-04-20 ENCOUNTER — Encounter: Admit: 2023-04-20 | Discharge: 2023-04-20 | Payer: MEDICARE

## 2023-05-09 ENCOUNTER — Encounter: Admit: 2023-05-09 | Discharge: 2023-05-09 | Payer: MEDICARE

## 2023-05-09 ENCOUNTER — Ambulatory Visit: Admit: 2023-05-09 | Discharge: 2023-05-09 | Payer: MEDICARE

## 2023-05-09 DIAGNOSIS — R911 Solitary pulmonary nodule: Secondary | ICD-10-CM

## 2023-05-09 DIAGNOSIS — Z01818 Encounter for other preprocedural examination: Secondary | ICD-10-CM

## 2023-05-09 DIAGNOSIS — I779 Disorder of arteries and arterioles, unspecified: Secondary | ICD-10-CM

## 2023-05-09 DIAGNOSIS — Z720 Tobacco use: Secondary | ICD-10-CM

## 2023-05-09 LAB — BASIC METABOLIC PANEL
ANION GAP: 9 g/dL (ref 3–12)
BLD UREA NITROGEN: 15 mg/dL (ref 7–25)
CALCIUM: 8.9 mg/dL (ref 8.5–10.6)
CHLORIDE: 103 MMOL/L (ref 98–110)
CO2: 26 MMOL/L (ref 21–30)
CREATININE: 0.9 mg/dL (ref 0.4–1.24)
EGFR: >60 mL/min (ref >60)
GLUCOSE,PANEL: 76 mg/dL (ref 70–100)
POTASSIUM: 4.4 MMOL/L (ref 3.5–5.1)
SODIUM: 138 MMOL/L (ref 137–147)

## 2023-05-09 LAB — CBC
RBC COUNT: 5 M/UL (ref 4.4–5.5)
WBC COUNT: 7.3 10*3/uL (ref 4.5–11.0)

## 2023-05-09 MED ORDER — LACTATED RINGERS IV SOLP
INTRAVENOUS | 0 refills
Start: 2023-05-09 — End: ?

## 2023-05-09 NOTE — Patient Instructions
Clinic Visit Summary:      It was a pleasure seeing you in clinic today.  If you have questions or concerns, please contact my nurse, Einar Crow, at [phone].    We encourage you to complete a Home Covid test 1-2 days prior to your procedure date.  If you test positive for Covid, please contact the Pulmonary Care Coordinator at [phone] to discuss rescheduling your procedure.  Patients who unknowingly have COVID and have a bronchoscopy may have an increased risk of complications following a pulmonary procedure.  You do not need to notify us if you test negative.    Please stop at the lab before leaving today to have blood drawn for testing that is needed prior to your procedure.    If you have any questions or concerns, please call me at [phone].    Einar Crow, RN       1.   Pre-procedure Instructions -     - You have been scheduled for a bronchoscopy on Monday, September 23 with a check in time of 7:15am and procedure time approximately at 9:45am.  This will be at the [facility] at [address] [place], [place] [address].    - You will need to check in at admitting at 7:15am.  Admitting is just inside the main hospital entrance on the left and is directly across from the Pharmacy.  Parking garage (P3) is located directly across from the main hospital entrance.    - You will need to arrange for a responsible ride/person to accompany you home due to sedation of anesthesia with your procedure.  A responsible person is a person who has the ability to identify a change in the patient's status and notify medical personnel.  This is typically a family member or friend.  Public transportation is permitted if you have a responsible person to accompany you.  An [employer], taxi or other public transportation driver is not considered a responsible person to accompany you home.      - You must have a driver the day of the procedure, for drop off and for pick up.  Only one (1) person is allowed to accompany you due to the size of the waiting room.     -  Do not bring money, jewelry, or other valuables to the hospital.  Please remove all jewelry and body piercings.        - Please bring photo identification, your insurance cards, and medical device card if you have one.      - If you wear glasses, hearing aids, or contact lenses, please bring a case to put them in and solutions for the contacts.    - Dress in loose, comfortable clothing.    The Day of the Procedure    - Nothing to eat or drink after midnight the day of your procedure.  This includes no gum, no hard candies, and no cough drops.     - You can use your inhalers and nebulizers and take your regular medications with a little sip of water (1-2 ounces) in the morning.      - You can brush your teeth and tongue.    - If you wear dentures, you will need to remove them prior to the procedure.      - Please do not smoke, vape, or use any tobacco products.       Results are generally available in 5 business days after the procedure.  Some results,  such as cultures, may take up to three weeks before being final. We will contact you to discuss the results.      After your procedure, please contact Einar Crow, RN at [phone] with signs and symptoms of worsening condition:    increased shortness of breath  increased coughing  new or increased productive cough with thick &/ or abnormal colored sputum  bloody sputum  chest tightness  chest pain  wheezing  fever, chills, or night sweats      - For urgent issues after business hours, weekends, or holidays: Call [phone] and request for the Pulmonary Fellow to be paged.      2.  Information about what to expect during a bronchoscopy         Direct Laryngoscopy with Bronchoscopy  Laryngoscopy and bronchoscopy are two procedures that may be done together. They let the healthcare provider see inside the air passages in the throat and lungs. A laryngoscopy looks at the throat and larynx, or vocal cords. Bronchoscopy looks at the airways including the trachea (windpipe), bronchi, and bronchioles. These procedures can be used to diagnose and treat certain problems. They can also be used to remove objects stuck in your throat or airways. A tissue sample may be taken for testing (biopsy). And certain problems, like cysts or scarring, can be treated. Your healthcare provider will tell you more about your procedure based on why it is being done.      Preparing for the procedure  Prepare for the procedure as you have been instructed. Be sure to tell your healthcare provider about all medicines you take. This includes over-the-counter medicines. It also includes herbs and other supplements. You may need to stop taking some or all of them before surgery. Your healthcare provider will tell you what to stop. Also, follow any directions you?re given for not eating or drinking before surgery.   The day of the procedure  The procedure takes approximately two (2) hours.  Total time from checking in to discharging is approximately 5-6 hours.   Before the procedure starts:   An IV (intravenous) line is put into a vein in your arm or hand. This line delivers fluids and medicines.  To keep you free of pain, you will be given general anesthesia. This may make you feel relaxed and drowsy. Local anesthesia may also be injected or sprayed into your throat to numb it. General anesthesia puts you in a state like deep sleep through out the procedure.  During the procedure  Here is what to expect during the procedure:  A tube with a light and a camera, called a scope, is used. The tube may be flexible or rigid.   The scope will be passed through the mouth and into the throat and then the trachea, bronchi, and bronchioles.  The scope can send live images from inside the air passages to a video screen. This lets the healthcare provider examine problems more closely.  If needed, a biopsy is done using small tools put through the scope.  Other tests or treatments may be done with different tools put through the scope.  After the procedure  You will be taken to a room to recover from the anesthesia. Your throat may feel numb or scratchy. Swallowing may feel strange at first. This will improve within a few hours. When you are released to go home, have an adult family member or friend ready to drive you.   Recovering at home  Once home,  follow any instructions you have been given. These include:  Don't eat or drink until swallowing returns to normal. As soon as you can swallow comfortably, drink plenty of water.  Use throat lozenges as advised by your healthcare provider to help ease throat soreness.  Rest your voice as instructed by your healthcare provider.  Plan to have someone stay with you for a few hours.  You want to make sure you are steady, no longer groggy, and that you are thinking clearly.  When to call your healthcare provider  After you get home, call the healthcare provider if you have any of the following:  Chest pain or trouble breathing (call 911)  Fever of 100.4? F ( 38? C) or higher, or as directed by your healthcare provider  Trouble swallowing doesn?t improve or gets worse  Pain that does not go away even after taking pain medicine  Severely hoarse voice  Severe nausea or vomiting  Bloody vomit  Cough that brings up more than tiny amounts of blood  Follow-up  Follow up with your healthcare provider, or as advised. Within a few weeks, you will receive test results. Your healthcare provider will discuss these with you on the phone or during a follow-up visit. Depending on what was found you may need further evaluation and treatment.   Risks and possible complications  Risks of this procedure include:   Bleeding  Infection  Swelling or injury of the throat  Gagging  Vomiting  Cuts in the mouth, nose, or throat  Injury to the teeth  Vocal cord injury  Breathing problems  Collapsed lung (pneumothorax)  Risks of anesthesia        3.  Visitor Information    It is difficult for the healthcare team to keep many people updated in a timely manner. We ask that you please choose a primary contact person to receive updates from the healthcare team.    - Your visitor(s) will be asked to stay in the waiting area when you go to the pre-procedure area.    ? Before the procedure - Staff will bring 1 visitor to the pre-procedure area after you are prepped.    ? After the procedure - Staff will bring 1 visitor to the recovery area after you are awake and feeling well.    For the safety, security, and privacy of or our patients, visitors, and staff, there may be times when visitors are not allowed in pre-procedure and recovery areas. In these situations, visitors will remain in waiting areas and will receive updates from staff.    Visitors are not allowed in procedure rooms.    Preventing the spread of illness - Visitors in operative and procedural areas must follow the same precautions for preventing infections that are in place for visitors throughout the rest of the hospital. Visitors who are sick should stay at home.      4.  Billing Questions    When we schedule your surgery, we also submit your surgery information to the hospital's pre-certification department.  They will contact your insurance company to verify benefits and obtain prior authorization.  Our office typically does not hear anything back from this department unless there is an issue.    If you have specific questions about your insurance, personal financial responsibility, or other billing questions, please call the Financial Counseling Department at [phone] or toll free [phone].  The Billing Department can be contacted at [phone].

## 2023-05-09 NOTE — Progress Notes
Pulmonary Clinic Note                  Date of Service: 05/09/2023    Chief Complaint:  lung nodule         History of Present Illness:   Daniel Braun is a 77 y.o. 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    He received a CT chest 02/28/2023 in [place] which noted a LLL nodule with irregular margins. He subsequently had a PET/CT on 04/02/2023 which noted a PET avid LLL nodule with irregular margins (~9 mm). Also has PET avid right mediastinal/hilar lymphadenopathy.    The patient has a 70 pack year history. He continues to smoke but has weaned down to 10 cigarettes daily. He denies fevers, chills, night sweats, weight loss, hemoptysis, worsening cough, chest pain, dyspnea, or prior hx of cancer. He does not know about FH as he was adopted. He worked in a foundry for many years. Notes hx of heavy metal exposure including in his blood.     He uses ASA 81 mg but otherwise used no anti-platelet or anti-coagulant medications. No hx of difficulty with anesthesia. No bleeding or clotting problems.            Past Medical History:   Past Medical History:   Diagnosis Date    Carotid arterial disease (HCC)        Past Surgical History:   Surgical History:   Procedure Laterality Date    ANGIOGRAPHY CORONARY ARTERY WITH LEFT HEART CATHETERIZATION N/A 07/01/2021    Performed by Roderic Palau, MD at [facility] CATH LAB    POSSIBLE PERCUTANEOUS CORONARY STENT PLACEMENT WITH ANGIOPLASTY N/A 07/01/2021    Performed by Roderic Palau, MD at [facility] CATH LAB    HX CATARACT REMOVAL      5.3.16 & 5.17.16       Family History:    Adopted    Social History:   Social History     Tobacco Use    Smoking status: Every Day     Current packs/day: 0.50     Average packs/day: 0.5 packs/day for 15.0 years (7.5 ttl pk-yrs)     Types: Cigarettes    Smokeless tobacco: Former Substance Use Topics    Drug use: Never       Allergies:    Patient has no known allergies.    Medications:   aspirin EC 81 mg tablet Take one tablet by mouth daily. Take with food.    atorvastatin (LIPITOR) 40 mg tablet Take two tablets by mouth daily. (Patient taking differently: Take one tablet by mouth twice daily.)    Cyanocobalamin (VITAMIN B-12) 2,000 mcg TbER Take  by mouth daily.    nitroglycerin (NITROSTAT) 0.4 mg tablet Place one tablet under tongue every 5 minutes as needed for Chest Pain.       Physical Exam:  Vitals:    05/09/23 1238   BP: (!) 150/75   BP Source: Arm, Left Upper   Pulse: 73   Temp: 36.6 ?C (97.8 ?F)   Resp: 18   SpO2: 100%   TempSrc: Oral   PainSc: Zero   Weight: 78 kg (172 lb)   Height: 177.8 cm (5' 10)     Body mass index is 24.68 kg/m?Marland Kitchen    GENERAL: Well-developed, Alert and oriented. In no apparent distress.  HEENT: Normocephalic and atraumatic. No scleral icterus. Posterior pharynx clear of any exudate or lesions.  LUNGS: Clear to auscultation bilaterally.  HEART: Regular rate. Regular rhythm. No murmur.  ABDOMEN: Soft. Non-tender. Non-distended.   EXTREMITIES: No edema. No clubbing. No cyanosis.   NEUROLOGIC: Cranial nerves II-XII are grossly intact.  PSYCHIATRIC: Normal mood and affect.  SKIN: No rashes observed.    Pertinent Labs:  No recent labs    Assessment     Pulmonary Function Testing:  No PFTs    Imaging:  PET 04/04/2023:  - Metabolically active LLL nodule with active mediastinal and hilar lymphadenopathy.     Assessment:    Daniel Braun has a 70 pack year smoking history and presents with LLL pulmonary nodule. There is some evidence of the nodule from CT in 2023, but it is certainly larger now. PET scan showed avidity of the nodule and contralateral lymph nodes. Certainly malignancy is on the differential. Could also be fungal. At this time would proceed with robot assisted transbronchial biopsy and EBUS for diagnosis. #LLL irregular pulmonary nodule  #Lymphadenopathy    Plan:  - Robot assisted transbronchial biopsy with EBUS/FNA    Patient seen and discussed with Dr. Wende Neighbors MD  Pulmonary/Critical Care  PGY-5

## 2023-05-16 ENCOUNTER — Ambulatory Visit: Admit: 2023-05-16 | Discharge: 2023-05-16 | Payer: MEDICARE

## 2023-05-16 ENCOUNTER — Encounter: Admit: 2023-05-16 | Discharge: 2023-05-16 | Payer: MEDICARE

## 2023-05-16 DIAGNOSIS — I779 Disorder of arteries and arterioles, unspecified: Secondary | ICD-10-CM

## 2023-05-16 MED ORDER — ROCURONIUM 10 MG/ML IV SOLN
INTRAVENOUS | 0 refills | Status: DC
Start: 2023-05-16 — End: 2023-05-16

## 2023-05-16 MED ORDER — PHENYLEPHRINE 40 MCG/ML IN NS IV DRIP (STD CONC)
INTRAVENOUS | 0 refills | Status: DC
Start: 2023-05-16 — End: 2023-05-16
  Administered 2023-05-16 (×2): .5 ug/kg/min via INTRAVENOUS

## 2023-05-16 MED ORDER — LIDOCAINE (PF) 200 MG/10 ML (2 %) IJ SYRG
INTRAVENOUS | 0 refills | Status: DC
Start: 2023-05-16 — End: 2023-05-16

## 2023-05-16 MED ORDER — DEXAMETHASONE SODIUM PHOSPHATE 4 MG/ML IJ SOLN
INTRAVENOUS | 0 refills | Status: DC
Start: 2023-05-16 — End: 2023-05-16

## 2023-05-16 MED ORDER — ONDANSETRON HCL (PF) 4 MG/2 ML IJ SOLN
INTRAVENOUS | 0 refills | Status: DC
Start: 2023-05-16 — End: 2023-05-16

## 2023-05-16 MED ORDER — ARTIFICIAL TEARS (PF) SINGLE DOSE DROPS GROUP
OPHTHALMIC | 0 refills | Status: DC
Start: 2023-05-16 — End: 2023-05-16

## 2023-05-16 MED ORDER — PROPOFOL INJ 10 MG/ML IV VIAL
INTRAVENOUS | 0 refills | Status: DC
Start: 2023-05-16 — End: 2023-05-16

## 2023-05-16 MED ORDER — SUGAMMADEX 100 MG/ML IV SOLN
INTRAVENOUS | 0 refills | Status: DC
Start: 2023-05-16 — End: 2023-05-16

## 2023-05-16 MED ORDER — FENTANYL CITRATE (PF) 50 MCG/ML IJ SOLN
INTRAVENOUS | 0 refills | Status: DC
Start: 2023-05-16 — End: 2023-05-16

## 2023-05-16 MED ORDER — PROPOFOL 10 MG/ML IV EMUL 100 ML (INFUSION)(AM)(OR)
INTRAVENOUS | 0 refills | Status: DC
Start: 2023-05-16 — End: 2023-05-16
  Administered 2023-05-16: 15:00:00 150 ug/kg/min via INTRAVENOUS

## 2023-05-16 MED ADMIN — LACTATED RINGERS IV SOLP [4318]: 1000.0000 mL | INTRAVENOUS | @ 14:00:00 | Stop: 2023-05-16 | NDC 00338011704

## 2023-05-16 NOTE — Research Notes
Research Informed Consent Note    NAME: Daniel Braun             MRN: [id number]             DOB:Mar 20, 1946          AGE: 78 y.o.    IRB Number: [id number]  Consent Approval Dates: 05-02-2023    Clinical research participation and research nature of the trial were discussed with subject. The subject was alert and oriented during consent discussion and was accompanied by his wife. Subject was informed that study is voluntary and  he may withdraw consent at any time for any reason by notifying study team.  Study purpose, procedures, benefits, risks and duration of the study, confidentiality information, and compensation were discussed.  Alternatives to participation were discussed per consent form.  Subject verbalized understanding.    Subject was provided time to review the consent form.  All questions asked were answered.  Subject voiced desire to participate in the study and signed the informed consent form without coercion and undue influence.  A copy of the signed consent was given to the subject as well as contact information for the study team.  A copy of the consent form was e-mailed to [facility] Information Management ([facility]) for scanning into the subject's medical record.    No research procedures took place prior to consenting.

## 2023-05-16 NOTE — Anesthesia Pre-Procedure Evaluation
Anesthesia Pre-Procedure Evaluation    Name: Daniel Braun      MRN: [id number]     DOB: 10/16/1945     Age: 77 y.o.     Sex: male   _________________________________________________________________________     Procedure Info:   Procedure Information       Date/Time: 05/16/23 0945    Procedures:       ROBOT ASSISTED BRONCHOSCOPY WITH IMAGE-GUIDED NAVIGATION- FLEXIBLE      BRONCHOSCOPY DIAGNOSTIC WITH CELL WASHING - FLEXIBLE      BRONCHOSCOPY WITH BRONCHIAL ALVEOLAR LAVAGE - FLEXIBLE      BRONCHOSCOPY WITH TRANSBRONCHIAL LUNG BIOPSY - FLEXIBLE - SINGLE LOBE      BRONCHOSCOPY WITH TRANSBRONCHIAL NEEDLE ASPIRATION AND BIOPSY TRACHEA/ MAIN STEM/ LOBAR BRONCHUS - FLEXIBLE      BRONCHOSCOPY WITH ENDOBRONCHIAL ULTRASOUND GUIDED TRANSTRACHEAL/ TRANSBRONCHIAL SAMPLING - 3 OR MORE MEDIASTINAL/ HILAR LYMPH NODE STATIONS/ STRUCTURE - FLEXIBLE    Location: PULM ROOM / Main OR/Periop    Surgeons: Garfield Cornea, MD            Physical Assessment  Vital Signs (last filed in past 24 hours):  BP: 146/77 (09/23 0915)  Pulse: 60 (09/23 0915)  Respirations: 15 PER MINUTE (09/23 0915)  SpO2: 99 % (09/23 0915)  O2 Device: None (Room air) (09/23 0915)  Height: 177.8 cm (5' 10) (09/23 8657)  Weight: 78.3 kg (172 lb 9.9 oz) (09/23 0903)      Patient History   No Known Allergies     Current Medications    Medication Directions   aspirin EC 81 mg tablet Take one tablet by mouth daily. Take with food.   atorvastatin (LIPITOR) 40 mg tablet Take two tablets by mouth daily.  Patient taking differently: Take one tablet by mouth twice daily.   Cyanocobalamin (VITAMIN B-12) 2,000 mcg TbER Take  by mouth daily.   nitroglycerin (NITROSTAT) 0.4 mg tablet Place one tablet under tongue every 5 minutes as needed for Chest Pain.       Review of Systems/Medical History      Patient summary reviewed      No history of anesthetic complications    No family history of anesthetic complications        Pulmonary           Cardiovascular         No hypertension          Coronary artery disease            No dysrhythmias      Hyperlipidemia      GI/Hepatic/Renal - negative                Neuro/Psych - negative      No seizures      Musculoskeletal - negative          Endocrine/Other - negative      No diabetes        No hypothyroidism      No hyperthyroidism        77 y.o. 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    Abdominal Findings:       Not obese    Neurological Findings:       Alert and oriented x 3    Constitutional findings:       No acute distress      Well-developed      Well-nourished       Previous Airway Procedure Notes Displaying the 3 most recent records   No records found.         Patient Lines/Drains/Airways Status       Active Lines:       Name Placement date Placement time Site Days    Peripheral IV 05/16/23 0922 Right Anterior Hand 20 G 05/16/23  0922  -- less than 1                  Diagnostic Tests  Hematology:   Lab Results   Component Value Date    HGB 15.9 05/09/2023    HCT 47.7 05/09/2023    PLTCT 252 05/09/2023    WBC 7.3 05/09/2023    MCV 94.8 05/09/2023    MCH 31.5 05/09/2023    MCHC 33.3 05/09/2023    MPV 8.6 05/09/2023    RDW 14.1 05/09/2023         General Chemistry:   Lab Results   Component Value Date    NA 138 05/09/2023    K 4.4 05/09/2023    CL 103 05/09/2023    CO2 26 05/09/2023    GAP 9 05/09/2023    BUN 15 05/09/2023    CR 0.90 05/09/2023    GLU 76 05/09/2023    CA 8.9 05/09/2023    ALBUMIN 4.0 05/28/2021    TOTBILI 0.3 05/28/2021      Coagulation: No results found for: PT, PTT, INR    PAC Plan    Anesthesia Plan    ASA score: 3   Plan: general and TIVA  NPO status: acceptable      Informed Consent  Anesthetic plan and risks discussed with patient.        Plan discussed with: anesthesiologist, CRNA and SRNA.      Alerts

## 2023-05-16 NOTE — Anesthesia Procedure Notes
Procedure: Airway Placement    AIRWAY INSERTION    Date/Time: 05/16/2023 10:10 AM    Patient location: OR  Urgency: elective  Difficult Airway: No            Airway Procedure  Indication(s) for airway management: surgery        no    Preoxygenated: yes  Patient position: sniffing  Neck stabilization: no in-line stabilization    Mask difficulty assessment: 2 - vent by mask + OA or adjuvant +/- NMBA      Procedure Outcome  Final airway type: endotracheal airway  Endotracheal airway: ETT          ETT size (mm): 8.5  Technique used for successful ETT placement: direct laryngoscopy  Devices/methods used in placement: intubating stylet  Insertion site: oral  Blade type: Macintosh   Laryngoscope/Videolaryngoscope blade size: 3  Cormack-Lehane classification: grade I - full view of glottis      Measured from: teeth   Depth: 23 cm  Amount of Air in Cuff: 8 ml  Number of attempts at approach: 1  Placement verified by auscultation and capnometry            Complications  Cardiovascular:   Pulmonary:   Procedure: airway not difficult  Medication:         Performed by: Sharla Kidney, Ludwig Clarks  Authorized by: Trinna Post, MD

## 2023-05-16 NOTE — Anesthesia Post-Procedure Evaluation
Post-Anesthesia Evaluation    Name: Daniel Braun      MRN: [id number]     DOB: 11-Sep-1945     Age: 77 y.o.     Sex: male   __________________________________________________________________________     Procedure Information       Anesthesia Start Date/Time: 05/16/23 0959    Procedures:       ROBOT ASSISTED BRONCHOSCOPY WITH IMAGE-GUIDED NAVIGATION- FLEXIBLE (Bilateral: Bronchus)      BRONCHOSCOPY DIAGNOSTIC WITH CELL WASHING - FLEXIBLE (Bilateral: Bronchus)      BRONCHOSCOPY WITH BRONCHIAL ALVEOLAR LAVAGE - FLEXIBLE (Bilateral: Bronchus)      BRONCHOSCOPY WITH TRANSBRONCHIAL LUNG BIOPSY - FLEXIBLE - SINGLE LOBE (Bilateral: Bronchus)      BRONCHOSCOPY WITH TRANSBRONCHIAL NEEDLE ASPIRATION AND BIOPSY TRACHEA/ MAIN STEM/ LOBAR BRONCHUS - FLEXIBLE (Bilateral: Bronchus)      BRONCHOSCOPY WITH ENDOBRONCHIAL ULTRASOUND GUIDED TRANSTRACHEAL/ TRANSBRONCHIAL SAMPLING - 3 OR MORE MEDIASTINAL/ HILAR LYMPH NODE STATIONS/ STRUCTURE - FLEXIBLE (Bilateral: Bronchus)    Location: PULM ROOM / Main OR/Periop    Surgeons: Postigo Jasahui, Maykol R, MD            Post-Anesthesia Vitals  BP: 117/62 (09/23 1200)  Temp: 36 ?C (96.8 ?F) (09/23 1125)  Pulse: 73 (09/23 1200)  Respirations: 13 PER MINUTE (09/23 1200)  SpO2: 95 % (09/23 1200)   Vitals Value Taken Time   BP 117/62 05/16/23 1200   Temp 36 ?C (96.8 ?F) 05/16/23 1125   Pulse 73 05/16/23 1200   Respirations 13 PER MINUTE 05/16/23 1200   SpO2 95 % 05/16/23 1200   O2 Device     ABP     ART BP           Post Anesthesia Evaluation Note    Evaluation location: Pre/Post  Patient participation: recovered; patient participated in evaluation  Level of consciousness: alert  Pain management: adequate    Hydration: normovolemia  Temperature: 36.0?C - 38.4?C  Airway patency: adequate    Perioperative Events       Post-op nausea and vomiting: no PONV    Postoperative Status  Cardiovascular status: hemodynamically stable  Respiratory status: spontaneous ventilation  Follow-up needed: none  Additional comments: All questions answered and patient agreeable with plan.        Perioperative Events  There were no known complications for this encounter.

## 2023-05-18 ENCOUNTER — Encounter: Admit: 2023-05-18 | Discharge: 2023-05-18 | Payer: MEDICARE

## 2023-05-18 DIAGNOSIS — I779 Disorder of arteries and arterioles, unspecified: Secondary | ICD-10-CM

## 2023-05-19 ENCOUNTER — Encounter: Admit: 2023-05-19 | Discharge: 2023-05-19 | Payer: MEDICARE

## 2023-05-19 NOTE — Telephone Encounter
Spoke with Mr. Daniel Braun via telephone regarding results of his biopsy. 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 Per [name] and his wife, he is very functional at baseline, able to walk a mile and climbing stairs w/o much difficulty. Could be a candidate for evaluation for resection, given preliminary staging of [id number]. Will request referral to CTS. Pt and wife agreeable with plan.

## 2023-05-20 ENCOUNTER — Encounter: Admit: 2023-05-20 | Discharge: 2023-05-20 | Payer: MEDICARE

## 2023-05-23 ENCOUNTER — Encounter: Admit: 2023-05-23 | Discharge: 2023-05-23 | Payer: MEDICARE

## 2023-05-23 DIAGNOSIS — C3432 Malignant neoplasm of lower lobe, left bronchus or lung: Secondary | ICD-10-CM

## 2023-05-23 DIAGNOSIS — I779 Disorder of arteries and arterioles, unspecified: Secondary | ICD-10-CM

## 2023-05-26 ENCOUNTER — Encounter: Admit: 2023-05-26 | Discharge: 2023-05-26 | Payer: MEDICARE

## 2023-05-26 DIAGNOSIS — C3432 Malignant neoplasm of lower lobe, left bronchus or lung: Secondary | ICD-10-CM

## 2023-05-26 DIAGNOSIS — E785 Hyperlipidemia, unspecified: Secondary | ICD-10-CM

## 2023-05-26 DIAGNOSIS — I2510 Atherosclerotic heart disease of native coronary artery without angina pectoris: Secondary | ICD-10-CM

## 2023-05-26 DIAGNOSIS — I779 Disorder of arteries and arterioles, unspecified: Secondary | ICD-10-CM

## 2023-06-01 ENCOUNTER — Encounter: Admit: 2023-06-01 | Discharge: 2023-06-01 | Payer: MEDICARE

## 2023-06-01 NOTE — Progress Notes
Date of Service: 06/02/2023    Name: Davide Henslee          DOB: May 17, 1946          MRN: [id number]    Referring Provider:  Boykin Nearing, Maykol* ([place])  PCP: Erskine Emery, MD [facility])  Cardiologist: Previously established with Dr. Anda Kraft ([place]), but has not seen him since 2022.    Chief Complaint   Patient presents with    New Patient       History of Present Illness  Kenn Schnoor is a 77 y.o. 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     Patient is a 77 year old male with a history of CAD, right BBB, HLD, carotid artery stenosis. Referral to Dr. Ignacia Palma for evaluation of left lower lobe squamous cell carcinoma. PET and EBUS completed prior to arrival today.    Work up summary:     01/25/2022: LDCT ([facility]-images in chart, report in Outside Records)-Emphysematous changes and bronchial wall thickening with minimal mucous plugging. There is scarring and atelectasis in the lung basis. Lung rads 2. Recommend repeat exam in 1 year.      02/28/2023: LDCT ([facility]- images in chart, report in Care Everywhere)-There has been interval development of a 10 x 6 mm spiculated pulmonary nodule in the superior segment of the left lower lobe. Pulmonology consult is advised.     04/02/2023 PET: ([facility]- images in chart, report in Care Everywhere)-        05/16/23 left lower lung nodule biopsy   Final Diagnosis:   A. LLL nodule biopsy:    Squamous cell carcinoma     05/16/2023: EBUS/Nav Bronch ([place])-LLL nodule: Squamous cell carcinoma. No metastatic disease in tested lymph nodes.     Final Diagnosis:   A. Lung-LLL, ION- guided FNA:    No definitive lesional tissue identified.   Please see concurrent surgical pathology report ([id number]).     B. Lymph Node-Station 11RS, EBUS- FNA:   Polymorphous population of lymphocytes. No carcinoma identified in   the sampled specimen.     C. Lymph Node-Station 10R, EBUS- FNA:   No lymphoid tissue or lesional tissue identified.      D. Lymph Node-Station 4R, EBUS- FNA:   Polymorphous population of lymphocytes. No carcinoma identified in   the sampled specimen.       E. Lymph Node-Station 7, EBUS- FNA:   Polymorphous population of lymphocytes. No carcinoma identified in   the sampled specimen.     F. Lymph Node-Station 4L, EBUS- FNA:   Polymorphous population of lymphocytes. No carcinoma identified in   the sampled specimen.        G. Lymph Node-Station 11L, EBUS- FNA:   Polymorphous population of lymphocytes. No carcinoma identified in   the sampled specimen.     06/02/23 CT Chest today -   1.  Stable spiculated superior segment left lower lobe biopsy-proven   squamous cell carcinoma.   2.  Stable prominent mediastinal lymph nodes with FDG avidity on previous   PET though favored to be reactive.   3.  Marked coronary artery and mild aortic valve calcification.      06/02/23 PFT's (preliminary)   FEV1 52%, DLCO 86%    He is here today to discuss possible surgical interventions.     Overall his symptoms have included cough, and shortness of breath with activity. Denies hemoptysis, fevers, chills, weight loss, or new bone pain.  He is here with his wife and son. Smokes 1/2-3/4 PPD. Has smoked for close to 70 years. Does not have any plan of quitting.     This was all reviewed today by Dr. Particia Nearing and was discussed at length with Ileene Musa.          History   Past Medical History:   Diagnosis Date    CAD (coronary artery disease)     Carotid arterial disease (HCC)     HLD (hyperlipidemia)     Malignant neoplasm of lower lobe of left lung (HCC) 05/23/2023     Surgical History:   Procedure Laterality Date    ANGIOGRAPHY CORONARY ARTERY WITH LEFT HEART CATHETERIZATION N/A 07/01/2021    Performed by Roderic Palau, MD at [facility] CATH LAB    POSSIBLE PERCUTANEOUS CORONARY STENT PLACEMENT WITH ANGIOPLASTY N/A 07/01/2021    Performed by Roderic Palau, MD at [facility] CATH LAB    ROBOT ASSISTED BRONCHOSCOPY WITH IMAGE-GUIDED NAVIGATION- FLEXIBLE Bilateral 05/16/2023    Performed by Boykin Nearing, Maykol R, MD at [facility] OR    BRONCHOSCOPY DIAGNOSTIC WITH CELL WASHING - FLEXIBLE Bilateral 05/16/2023    Performed by Boykin Nearing, Maykol R, MD at [facility] OR    BRONCHOSCOPY WITH TRANSBRONCHIAL LUNG BIOPSY - FLEXIBLE - SINGLE LOBE Bilateral 05/16/2023    Performed by Boykin Nearing, Maykol R, MD at [facility] OR    BRONCHOSCOPY WITH TRANSBRONCHIAL NEEDLE ASPIRATION AND BIOPSY TRACHEA/ MAIN STEM/ LOBAR BRONCHUS - FLEXIBLE Bilateral 05/16/2023    Performed by Boykin Nearing, Maykol R, MD at [facility] OR    BRONCHOSCOPY WITH ENDOBRONCHIAL ULTRASOUND GUIDED TRANSTRACHEAL/ TRANSBRONCHIAL SAMPLING - 3 OR MORE MEDIASTINAL/ HILAR LYMPH NODE STATIONS/ STRUCTURE - FLEXIBLE Bilateral 05/16/2023    Performed by Boykin Nearing, Maykol R, MD at [facility] OR    HX CATARACT REMOVAL      5.3.16 & 5.17.16     Social History     Socioeconomic History    Marital status: Married   Tobacco Use    Smoking status: Every Day     Current packs/day: 0.50     Average packs/day: 0.5 packs/day for 15.0 years (7.5 ttl pk-yrs)     Types: Cigarettes    Smokeless tobacco: Former     Types: Musician Use    Vaping status: Never Used   Substance and Sexual Activity    Alcohol use: Not Currently    Drug use: Never     Vaping/E-liquid Use    Vaping Use Never User      Vaping/E-liquid Substances    CBD No     Nicotine No     Other No     Flavored No     THC No     Unknown No            Family History   Problem Relation Name Age of Onset    None Reported Mother      None Reported Father      Cancer Sister      Premature Heart Disease Sister       No Known Allergies  Immunization History   Administered Date(s) Administered    COVID-19 (MODERNA), mRNA vacc, 100 mcg/0.5 mL (PF) 09/20/2019, 10/18/2019, 07/20/2020    Flu Vaccine =>65 YO High-Dose Quadrivalent (PF) 05/08/2019    Flu Vaccine, Quadrivalent, Adjuvanted (Preservative Free) 06/26/2020    Pneumococcal Vaccine (23-Val Adult) 09/28/2012 Pneumococcal Vaccine(13-Val Peds/immunocompromised adult) 05/28/2015    Tdap Vaccine 05/28/2015  Medications:    aspirin EC 81 mg tablet Take one tablet by mouth daily. Take with food.    atorvastatin (LIPITOR) 40 mg tablet Take two tablets by mouth daily. (Patient taking differently: Take one tablet by mouth twice daily.)    Cyanocobalamin (VITAMIN B-12) 2,000 mcg TbER Take  by mouth daily.    nitroglycerin (NITROSTAT) 0.4 mg tablet Place one tablet under tongue every 5 minutes as needed for Chest Pain.       I have reviewed the PTA meds and they are correct.    Anti-coagulation: aspirin 81mg   Occupation: none     Functional Assessment   ECOG Performance Status: 1, Restricted in physically strenuous activity but ambulatory and able to carry out work of a light or sedentary nature, e.g., light house work, office work   Pain Scale (0 = No Pain, 10 = Severe Pain): 0  Comments/Interventions: none    Clinical Nutrition Status  PO Intake compared to normal: Normal  Weight loss last 3 months:  0 lbs  Estimated body mass index is 24.97 kg/m? as calculated from the following:    Height as of this encounter: 177.8 cm (5' 10).    Weight as of this encounter: 78.9 kg (174 lb).  BMI class: Acceptable (19 to <25)  Patient demonstrates no malnutrition      ROS  Constitutional: Negative.   HENT: Negative.     Eyes: Negative.    Cardiovascular: Negative.    Respiratory:  Positive for cough (consistent).    Endocrine: Negative.    Hematologic/Lymphatic: Negative.    Skin: Negative.    Musculoskeletal: Negative.    Gastrointestinal: Negative.    Genitourinary: Negative.    Neurological: Negative.    Psychiatric/Behavioral: Negative.     Allergic/Immunologic: Negative.   Review of systems Obtained from patient    Physical Exam  Neuro: Alert, oriented x4   General: Alert, cooperative, no distress, appears stated age  Head: Normocephalic, no obvious deformity  Eyes: PERRLA, EOM's intact  Respiratory: On room air,nonlabored breathing  CV: HR and BP stable. RRR  Abdomen: Soft, NTTP no masses  Extremities: normal, atraumatic, no cyanosis or edema  Skin: appropriate for ethnicity, no rashes or lesions    Vitals:    06/02/23 0906   BP: 98/60   BP Source: Arm, Left Upper   Pulse: 79   SpO2: 97%   PainSc: Zero   Weight: 78.9 kg (174 lb)   Height: 177.8 cm (5' 10)        Objective  Vitals:    06/02/23 0906   BP: 98/60   Pulse: 79   SpO2: 97%         Primary Diagnosis:   Encounter Diagnoses   Name Primary?    Malignant neoplasm of lower lobe of left lung (HCC) Yes    Coronary artery disease involving native coronary artery of native heart with other form of angina pectoris (HCC)     Bilateral carotid artery stenosis     Hypercholesterolemia     RBBB (right bundle branch block with left anterior fascicular block)     Shortness of breath     Lung nodule     Pulmonary emphysema, unspecified emphysema type [facility]          Currently Active Problems:  Patient Active Problem List    Diagnosis Date Noted    Malignant neoplasm of lower lobe of left lung (HCC) 05/23/2023    Lung nodule 05/16/2023    Coronary  artery disease 06/29/2021    Bilateral carotid artery stenosis 06/29/2021    Hypercholesterolemia 06/29/2021    RBBB (right bundle branch block with left anterior fascicular block) 06/29/2021    Shortness of breath 06/29/2021         Assessment and Plan  1. Malignant neoplasm of lower lobe of left lung (HCC)    2. Coronary artery disease involving native coronary artery of native heart with other form of angina pectoris (HCC)    3. Bilateral carotid artery stenosis    4. Hypercholesterolemia    5. RBBB (right bundle branch block with left anterior fascicular block)    6. Shortness of breath    7. Lung nodule    8. Pulmonary emphysema, unspecified emphysema type (HCC)          Dr. Particia Nearing has reviewed this patient's most recent procedures and history and does feel that surgery is warranted. He will undergo a robot assisted left lower lobe wedge resection for his biopsy proven squamous cell carcinoma. The entire procedure was discussed at length including risks, benefits, alternatives and possible complications. Ileene Musa understands, accepts and wishes to proceed. He will need PAT and nuclear cardiac stress test prior to surgery and this was ordered today. Thank you very much for allowing [place] to participate in this pleasant patient's care. We will keep you apprised of his progress. Please do not hesitate to contact us with any questions or concerns.    Robyn Crane-McCallister, APRN, FNP-C          ATTESTATION     I personally interviewed and examined the patient.  I have reviewed the history, physical, impression and plan outlined by the Nurse Practitioner.     I had the pleasure of seeing Mr. Yamamoto today in thoracic surgery clinic.  As you are aware he is a 77 year old gentleman with biopsy-proven left lower lobe squamous cell carcinoma who presents for surgical evaluation.  Prior to arrival in clinic the patient had pulmonary function test demonstrating FEV1 of 52% and DLCO 86%.  PET scan demonstrated PET avidity of this left lower lobe nodule along with hilar lymphadenopathy.  Patient underwent EBUS and navigational bronchoscopy which demonstrated the left lower lobe squamous cell carcinoma with lymph nodes being negative for malignancy.  Patient does currently smoke about 1/2 pack/day of cigarettes.  Patient otherwise denies any hemoptysis, bone pain, headaches or other constitutional symptoms.  Patient does have a past medical history significant for coronary artery disease, bilateral carotid artery stenosis, hypercholesterolemia, and COPD.    On physical exam he is 5 feet 10 inches weighing 174 kg giving him a BMI of 25.0.  Remainder physical exam per nurse practitioner.    Today we discussed the risks (Bleeding, infection, arrhythmias, damage to any surrounding nerves/vessels/tissues that may require additional procedures or reoperation, chylothorax, acute respiratory failure, prolonged intubation, prolonged air leak, chronic pain, and/or anastomotic leak.  Remote chance of paralysis, cardiac arrest, stroke, and/or death. Risks of blood transfusion and adverse reactions were discussed with the patient.), benefits, and alternatives associated with a robotic assisted left lower lobe wedge resection versus potential SBRT.  At this time patient would like to pursue surgical intervention.  As such we will plan for surgery on July 04, 2023.  Prior to surgical intervention patient will need to see our preoperative anesthesia screening along with obtaining a stress test.  I appreciate the opportunity to participate in his care.  Please feel free to contact my office with any  questions or concerns.    Staff name: Oleh Genin. Ignacia Palma, MD

## 2023-06-02 ENCOUNTER — Ambulatory Visit: Admit: 2023-06-02 | Discharge: 2023-06-02 | Payer: MEDICARE

## 2023-06-02 ENCOUNTER — Encounter: Admit: 2023-06-02 | Discharge: 2023-06-02 | Payer: MEDICARE

## 2023-06-02 DIAGNOSIS — I25118 Atherosclerotic heart disease of native coronary artery with other forms of angina pectoris: Secondary | ICD-10-CM

## 2023-06-02 DIAGNOSIS — E7800 Pure hypercholesterolemia, unspecified: Secondary | ICD-10-CM

## 2023-06-02 DIAGNOSIS — I779 Disorder of arteries and arterioles, unspecified: Secondary | ICD-10-CM

## 2023-06-02 DIAGNOSIS — R0602 Shortness of breath: Secondary | ICD-10-CM

## 2023-06-02 DIAGNOSIS — I452 Bifascicular block: Secondary | ICD-10-CM

## 2023-06-02 DIAGNOSIS — C3432 Malignant neoplasm of lower lobe, left bronchus or lung: Secondary | ICD-10-CM

## 2023-06-02 DIAGNOSIS — J439 Emphysema, unspecified: Secondary | ICD-10-CM

## 2023-06-02 DIAGNOSIS — R911 Solitary pulmonary nodule: Secondary | ICD-10-CM

## 2023-06-02 DIAGNOSIS — I6523 Occlusion and stenosis of bilateral carotid arteries: Secondary | ICD-10-CM

## 2023-06-02 DIAGNOSIS — I2510 Atherosclerotic heart disease of native coronary artery without angina pectoris: Secondary | ICD-10-CM

## 2023-06-02 DIAGNOSIS — E785 Hyperlipidemia, unspecified: Secondary | ICD-10-CM

## 2023-06-02 DIAGNOSIS — C3490 Malignant neoplasm of unspecified part of unspecified bronchus or lung: Secondary | ICD-10-CM

## 2023-06-02 MED ORDER — SODIUM CHLORIDE 0.9 % IJ SOLN
50 mL | Freq: Once | INTRAVENOUS | 0 refills | Status: CP
Start: 2023-06-02 — End: ?
  Administered 2023-06-02: 13:00:00 50 mL via INTRAVENOUS

## 2023-06-02 MED ORDER — IOHEXOL 350 MG IODINE/ML IV SOLN
70 mL | Freq: Once | INTRAVENOUS | 0 refills | Status: CP
Start: 2023-06-02 — End: ?
  Administered 2023-06-02: 13:00:00 70 mL via INTRAVENOUS

## 2023-06-02 NOTE — Patient Instructions
You are scheduled for surgery with Dr. Ignacia Palma 11/1    If you have any questions, please contact Mitzi Davenport or Maralyn Sago at [phone] (M-F 8a-4:30p)  After hours, you may call [phone] (Evenings/Weekends/Holidays)

## 2023-06-07 DIAGNOSIS — R911 Solitary pulmonary nodule: Secondary | ICD-10-CM

## 2023-06-07 DIAGNOSIS — C3432 Malignant neoplasm of lower lobe, left bronchus or lung: Secondary | ICD-10-CM

## 2023-06-07 DIAGNOSIS — C3490 Malignant neoplasm of unspecified part of unspecified bronchus or lung: Secondary | ICD-10-CM

## 2023-06-10 ENCOUNTER — Encounter: Admit: 2023-06-10 | Discharge: 2023-06-10 | Payer: MEDICARE

## 2023-06-10 LAB — POC GLUCOSE: POC GLUCOSE: 86 mg/dL (ref 70–100)

## 2023-06-10 MED ORDER — RP DX F-18 FDG MCI
10 | Freq: Once | INTRAVENOUS | 0 refills | Status: CP
Start: 2023-06-10 — End: ?
  Administered 2023-06-10: 16:00:00 12.7 via INTRAVENOUS

## 2023-06-14 ENCOUNTER — Encounter: Admit: 2023-06-14 | Discharge: 2023-06-14 | Payer: MEDICARE

## 2023-06-14 DIAGNOSIS — R911 Solitary pulmonary nodule: Secondary | ICD-10-CM

## 2023-06-14 DIAGNOSIS — C3432 Malignant neoplasm of lower lobe, left bronchus or lung: Secondary | ICD-10-CM

## 2023-06-14 MED ORDER — SODIUM CHLORIDE 0.9% IV SOLP
INTRAVENOUS | 0 refills
Start: 2023-06-14 — End: ?

## 2023-06-14 MED ORDER — HEPARIN, PORCINE (PF) 5,000 UNIT/0.5 ML IJ SYRG
5000 [IU] | Freq: Once | SUBCUTANEOUS | 0 refills
Start: 2023-06-14 — End: ?

## 2023-06-14 MED ORDER — LIDOCAINE (PF) 10 MG/ML (1 %) IJ SOLN
.2 mL | INTRAMUSCULAR | 0 refills | PRN
Start: 2023-06-14 — End: ?

## 2023-06-15 ENCOUNTER — Encounter: Admit: 2023-06-15 | Discharge: 2023-06-15 | Payer: MEDICARE

## 2023-06-15 NOTE — H&P (View-Only)
This is a copy of an H/P office visit with Dr. Particia Nearing on 06/02/2023  Daniel Harman, MD  Physician  Specialty: Thoracic Surgery     Progress Notes      Signed     Creation Time: 06/01/23 1438     Expand All Collapse All    Date of Service: 06/02/2023     Name: Daniel Braun          DOB: 1946/07/02          MRN: [id number]     Referring Provider:  Boykin Nearing, Maykol* ([place])  PCP: Erskine Emery, MD ([place])  Cardiologist: Previously established with Dr. Anda Kraft ([place]), but has not seen him since 2022.         Chief Complaint   Patient presents with    New Patient         History of Present Illness  Daniel Braun is a 77 y.o. 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      Patient is a 77 year old male with a history of CAD, right BBB, HLD, carotid artery stenosis. Referral to Dr. Ignacia Palma for evaluation of left lower lobe squamous cell carcinoma. PET and EBUS completed prior to arrival today.     Work up summary:      01/25/2022: LDCT ([facility]-images in chart, report in Outside Records)-Emphysematous changes and bronchial wall thickening with minimal mucous plugging. There is scarring and atelectasis in the lung basis. Lung rads 2. Recommend repeat exam in 1 year.      02/28/2023: LDCT ([facility]- images in chart, report in Care Everywhere)-There has been interval development of a 10 x 6 mm spiculated pulmonary nodule in the superior segment of the left lower lobe. Pulmonology consult is advised.     04/02/2023 PET: ([facility]- images in chart, report in Care Everywhere)-        05/16/23 left lower lung nodule biopsy   Final Diagnosis:   A. LLL nodule biopsy:    Squamous cell carcinoma      05/16/2023: EBUS/Nav Bronch ([place])-LLL nodule: Squamous cell carcinoma. No metastatic disease in tested lymph nodes.     Final Diagnosis:   A. Lung-LLL, ION- guided FNA:    No definitive lesional tissue identified.   Please see concurrent surgical pathology report ([id number]).     B. Lymph Node-Station 11RS, EBUS- FNA:   Polymorphous population of lymphocytes. No carcinoma identified in   the sampled specimen.     C. Lymph Node-Station 10R, EBUS- FNA:   No lymphoid tissue or lesional tissue identified.      D. Lymph Node-Station 4R, EBUS- FNA:   Polymorphous population of lymphocytes. No carcinoma identified in   the sampled specimen.       E. Lymph Node-Station 7, EBUS- FNA:   Polymorphous population of lymphocytes. No carcinoma identified in   the sampled specimen.     F. Lymph Node-Station 4L, EBUS- FNA:   Polymorphous population of lymphocytes. No carcinoma identified in   the sampled specimen.        G. Lymph Node-Station 11L, EBUS- FNA:   Polymorphous population of lymphocytes. No carcinoma identified in   the sampled specimen.      06/02/23 CT Chest today -   1.  Stable spiculated superior segment left lower lobe biopsy-proven   squamous cell carcinoma.   2.  Stable prominent mediastinal lymph nodes with FDG avidity on previous  PET though favored to be reactive.   3.  Marked coronary artery and mild aortic valve calcification.       06/02/23 PFT's (preliminary)   FEV1 52%, DLCO 86%     He is here today to discuss possible surgical interventions.      Overall his symptoms have included cough, and shortness of breath with activity. Denies hemoptysis, fevers, chills, weight loss, or new bone pain. He is here with his wife and son. Smokes 1/2-3/4 PPD. Has smoked for close to 70 years. Does not have any plan of quitting.      This was all reviewed today by Dr. Particia Nearing and was discussed at length with Ileene Musa.              History   Past Medical History        Past Medical History:   Diagnosis Date    CAD (coronary artery disease)      Carotid arterial disease (HCC)      HLD (hyperlipidemia)      Malignant neoplasm of lower lobe of left lung (HCC) 05/23/2023         Surgical history         Surgical History:   Procedure Laterality Date ANGIOGRAPHY CORONARY ARTERY WITH LEFT HEART CATHETERIZATION N/A 07/01/2021     Performed by Roderic Palau, MD at [facility] CATH LAB    POSSIBLE PERCUTANEOUS CORONARY STENT PLACEMENT WITH ANGIOPLASTY N/A 07/01/2021     Performed by Roderic Palau, MD at [facility] CATH LAB    ROBOT ASSISTED BRONCHOSCOPY WITH IMAGE-GUIDED NAVIGATION- FLEXIBLE Bilateral 05/16/2023     Performed by Boykin Nearing, Maykol R, MD at [facility] OR    BRONCHOSCOPY DIAGNOSTIC WITH CELL WASHING - FLEXIBLE Bilateral 05/16/2023     Performed by Boykin Nearing, Maykol R, MD at [facility] OR    BRONCHOSCOPY WITH TRANSBRONCHIAL LUNG BIOPSY - FLEXIBLE - SINGLE LOBE Bilateral 05/16/2023     Performed by Boykin Nearing, Maykol R, MD at [facility] OR    BRONCHOSCOPY WITH TRANSBRONCHIAL NEEDLE ASPIRATION AND BIOPSY TRACHEA/ MAIN STEM/ LOBAR BRONCHUS - FLEXIBLE Bilateral 05/16/2023     Performed by Boykin Nearing, Maykol R, MD at [facility] OR    BRONCHOSCOPY WITH ENDOBRONCHIAL ULTRASOUND GUIDED TRANSTRACHEAL/ TRANSBRONCHIAL SAMPLING - 3 OR MORE MEDIASTINAL/ HILAR LYMPH NODE STATIONS/ STRUCTURE - FLEXIBLE Bilateral 05/16/2023     Performed by Boykin Nearing, Maykol R, MD at [facility] OR    HX CATARACT REMOVAL         5.3.16 & 5.17.16         Social History   Social History            Socioeconomic History    Marital status: Married   Tobacco Use    Smoking status: Every Day       Current packs/day: 0.50       Average packs/day: 0.5 packs/day for 15.0 years (7.5 ttl pk-yrs)       Types: Cigarettes    Smokeless tobacco: Former       Types: Financial planner    Vaping status: Never Used   Substance and Sexual Activity    Alcohol use: Not Currently    Drug use: Never               Vaping/E-liquid Use    Vaping Use Never User              Vaping/E-liquid Substances    CBD No  Nicotine No      Other No      Flavored No      THC No      Unknown No        Family History          Family History   Problem Relation Name Age of Onset    None Reported Mother        None Reported Father        Cancer Sister        Premature Heart Disease Sister             Allergies   No Known Allergies          Immunization History   Administered Date(s) Administered    COVID-19 (MODERNA), mRNA vacc, 100 mcg/0.5 mL (PF) 09/20/2019, 10/18/2019, 07/20/2020    Flu Vaccine =>65 YO High-Dose Quadrivalent (PF) 05/08/2019    Flu Vaccine, Quadrivalent, Adjuvanted (Preservative Free) 06/26/2020    Pneumococcal Vaccine (23-Val Adult) 09/28/2012    Pneumococcal Vaccine(13-Val Peds/immunocompromised adult) 05/28/2015    Tdap Vaccine 05/28/2015            Medications:    aspirin EC 81 mg tablet Take one tablet by mouth daily. Take with food.    atorvastatin (LIPITOR) 40 mg tablet Take two tablets by mouth daily. (Patient taking differently: Take one tablet by mouth twice daily.)    Cyanocobalamin (VITAMIN B-12) 2,000 mcg TbER Take  by mouth daily.    nitroglycerin (NITROSTAT) 0.4 mg tablet Place one tablet under tongue every 5 minutes as needed for Chest Pain.         I have reviewed the PTA meds and they are correct.     Anti-coagulation: aspirin 81mg   Occupation: none      Functional Assessment   ECOG Performance Status: 1, Restricted in physically strenuous activity but ambulatory and able to carry out work of a light or sedentary nature, e.g., light house work, office work   Pain Scale (0 = No Pain, 10 = Severe Pain): 0  Comments/Interventions: none     Clinical Nutrition Status  PO Intake compared to normal: Normal  Weight loss last 3 months:  0 lbs  Estimated body mass index is 24.97 kg/m? as calculated from the following:    Height as of this encounter: 177.8 cm (5' 10).    Weight as of this encounter: 78.9 kg (174 lb).  BMI class: Acceptable (19 to <25)  Patient demonstrates no malnutrition        ROS  Constitutional: Negative.   HENT: Negative.     Eyes: Negative.    Cardiovascular: Negative.    Respiratory:  Positive for cough (consistent).    Endocrine: Negative.    Hematologic/Lymphatic: Negative.    Skin: Negative. Musculoskeletal: Negative.    Gastrointestinal: Negative.    Genitourinary: Negative.    Neurological: Negative.    Psychiatric/Behavioral: Negative.     Allergic/Immunologic: Negative.   Review of systems Obtained from patient     Physical Exam  Neuro: Alert, oriented x4   General: Alert, cooperative, no distress, appears stated age  Head: Normocephalic, no obvious deformity  Eyes: PERRLA, EOM's intact  Respiratory: On room air,nonlabored breathing  CV: HR and BP stable. RRR  Abdomen: Soft, NTTP no masses  Extremities: normal, atraumatic, no cyanosis or edema  Skin: appropriate for ethnicity, no rashes or lesions         Vitals:     06/02/23 0906   BP: 98/60  BP Source: Arm, Left Upper   Pulse: 79   SpO2: 97%   PainSc: Zero   Weight: 78.9 kg (174 lb)   Height: 177.8 cm (5' 10)         Objective      Vitals:     06/02/23 0906   BP: 98/60   Pulse: 79   SpO2: 97%            Primary Diagnosis:        Encounter Diagnoses   Name Primary?    Malignant neoplasm of lower lobe of left lung (HCC) Yes    Coronary artery disease involving native coronary artery of native heart with other form of angina pectoris (HCC)      Bilateral carotid artery stenosis      Hypercholesterolemia      RBBB (right bundle branch block with left anterior fascicular block)      Shortness of breath      Lung nodule      Pulmonary emphysema, unspecified emphysema type [facility])            Currently Active Problems:       Patient Active Problem List     Diagnosis Date Noted    Malignant neoplasm of lower lobe of left lung (HCC) 05/23/2023    Lung nodule 05/16/2023    Coronary artery disease 06/29/2021    Bilateral carotid artery stenosis 06/29/2021    Hypercholesterolemia 06/29/2021    RBBB (right bundle branch block with left anterior fascicular block) 06/29/2021    Shortness of breath 06/29/2021            Assessment and Plan  1. Malignant neoplasm of lower lobe of left lung (HCC)    2. Coronary artery disease involving native coronary artery of native heart with other form of angina pectoris (HCC)    3. Bilateral carotid artery stenosis    4. Hypercholesterolemia    5. RBBB (right bundle branch block with left anterior fascicular block)    6. Shortness of breath    7. Lung nodule    8. Pulmonary emphysema, unspecified emphysema type (HCC)             Dr. Particia Nearing has reviewed this patient's most recent procedures and history and does feel that surgery is warranted. He will undergo a robot assisted left lower lobe wedge resection for his biopsy proven squamous cell carcinoma. The entire procedure was discussed at length including risks, benefits, alternatives and possible complications. Ileene Musa understands, accepts and wishes to proceed. He will need PAT and nuclear cardiac stress test prior to surgery and this was ordered today. Thank you very much for allowing [place] to participate in this pleasant patient's care. We will keep you apprised of his progress. Please do not hesitate to contact us with any questions or concerns.     Robyn Crane-McCallister, APRN, FNP-C         Assessment & Plan  ATTESTATION     I personally interviewed and examined the patient.  I have reviewed the history, physical, impression and plan outlined by the Nurse Practitioner.      I had the pleasure of seeing Mr. Eadie today in thoracic surgery clinic.  As you are aware he is a 77 year old gentleman with biopsy-proven left lower lobe squamous cell carcinoma who presents for surgical evaluation.  Prior to arrival in clinic the patient had pulmonary function test demonstrating FEV1 of 52% and DLCO 86%.  PET scan demonstrated  PET avidity of this left lower lobe nodule along with hilar lymphadenopathy.  Patient underwent EBUS and navigational bronchoscopy which demonstrated the left lower lobe squamous cell carcinoma with lymph nodes being negative for malignancy.  Patient does currently smoke about 1/2 pack/day of cigarettes.  Patient otherwise denies any hemoptysis, bone pain, headaches or other constitutional symptoms.  Patient does have a past medical history significant for coronary artery disease, bilateral carotid artery stenosis, hypercholesterolemia, and COPD.     On physical exam he is 5 feet 10 inches weighing 174 kg giving him a BMI of 25.0.  Remainder physical exam per nurse practitioner.     Today we discussed the risks (Bleeding, infection, arrhythmias, damage to any surrounding nerves/vessels/tissues that may require additional procedures or reoperation, chylothorax, acute respiratory failure, prolonged intubation, prolonged air leak, chronic pain, and/or anastomotic leak.  Remote chance of paralysis, cardiac arrest, stroke, and/or death. Risks of blood transfusion and adverse reactions were discussed with the patient.), benefits, and alternatives associated with a robotic assisted left lower lobe wedge resection versus potential SBRT.  At this time patient would like to pursue surgical intervention.  As such we will plan for surgery on July 04, 2023.  Prior to surgical intervention patient will need to see our preoperative anesthesia screening along with obtaining a stress test.  I appreciate the opportunity to participate in his care.  Please feel free to contact my office with any questions or concerns.     Staff name: Oleh Genin. Ignacia Palma, MD                                 Electronically signed by Crane-McCallister, Robyn A, APRN-NP at 06/02/23 1344

## 2023-06-15 NOTE — Discharge Instructions - Pharmacy
Physician Discharge Summary    Name: Daniel Braun  Medical Record Number: [id number]        Account Number:  [id number]  Date Of Birth:  1946/02/28                         Age:  77 years   Admit date:  06/24/2023                     Discharge date:  06/25/2023    Attending Physician:  Particia Nearing                  Physician Summary completed by: [place] Cash, DO    Reason for hospitalization: Malignant neoplasm of unspecified part of unspecified bronchus or lung (HCC) [C34.90]  Squamous cell carcinoma of lower lobe of left lung (HCC) [C34.32]    Significant PMH:   Past Medical History:   Diagnosis Date    CAD (coronary artery disease)     Carotid arterial disease (HCC)     Chronic diastolic heart failure, NYHA class 1 (HCC) 06/21/2023    Noted on Echocardiogram 2022      Dysrhythmias     known RBBB    GERD (gastroesophageal reflux disease)     HLD (hyperlipidemia)     Malignant neoplasm of lower lobe of left lung (HCC) 05/23/2023    Stage 3 severe COPD by GOLD classification (HCC) 06/21/2023         Allergies: Patient has no known allergies.    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  The patient underwent the procedure and tolerated it well (please see operative report for further details).  Post-operatively the patient was monitored on the Cardiothoracic Progressive Care Unit and remained there for the duration of their hospitalization.  Post-operative pain was managed with an IV PCA Dilaudid.  On POD #1 the patient was transitioned from step-down to telemetry status.  The patient's PCA was weaned down and off as the patient was transitioned to oral pain medication.  Their chest tube was watersealed in the post op unit.  They remained stable and increased their activity and oral intake.  Their chest tube was removed on POD#1.  A follow-up 2 view chest xray was stable.  The patient had normal bowel and bladder function.  The patient was stable to be discharged home on POD# 1.     Pending items needing follow up: Suture removal at clinic visit and pathology.     Condition at Discharge: Stable    Patient Disposition: Home       Discharge Diagnoses:    Hospital Problems          Active Problems    * (Principal) Squamous cell carcinoma of lower lobe of left lung (HCC)    Coronary artery disease    Bilateral carotid artery stenosis    RBBB (right bundle branch block with left anterior fascicular block)    Shortness of breath    Lung  nodule    Malignant neoplasm of lower lobe of left lung (HCC)    Stage 3 severe COPD by GOLD classification (HCC)    Chronic diastolic heart failure, NYHA class 1 (HCC)    Chest tube in place    Post-op pain    Hypotension    Hypocalcemia    Hypomagnesemia    Hyperglycemia       Surgical Procedures:  06/24/23 Dr. Particia Nearing -   Left robotic assisted video thoracosopy with left lower lobe wedge resection (Left)  Left robotic assisted THORACOSCOPY WITH MEDIASTINAL AND REGIONAL LYMPHADENECTOMY (Left)  BLOCK, NERVE, INTERCOSTAL, 2 OR MORE (Left)  BRONCHOSCOPY DIAGNOSTIC FLEXIBLE    Significant Diagnostic Studies and Procedures:   Noted in brief hospital course    Consults:  None    Patient instructions/medications:      Chest 2 Views   Standing Status: Future Standing Exp. Date: 06/20/24        2 weeks-approximate     Reason for exam:(Sign,Symptom,Reason) atelectasis, s/p thoracic surgery      Regular Diet    You have no dietary restriction. Please continue with a healthy balanced diet.     Other Activity Restrictions    -You should and need to walk daily. Your goal is to walk 30 minutes at at time without stopping for breaks. This is a daily exercise routine that you should start as soon as you arrive home. Your basic daily activities do not count toward your 30 minute minimum, e.g. housework, toileting, fixing meals. Do not exercise outside in extremely hot or cold temperatures.  -Bathing: NO tub baths, hot tubs, or swimming for 6 weeks. You may shower at any time.  -Driving: NO driving for a minimum of 2 weeks. In addition, if you are using narcotic pain medication or experiencing unsteadiness, dizziness, lightheadedness, or instability in movement, you should not be operating any type of vehicle; car, tractor, etc. Your surgeon may provide additional driving restrictions at their discretion based on the type and extent of surgery you had. Safely operating a vehicle is the responsibility of the driver.  -Lifting: NO lifting more than 10 pounds (gallon of milk) for 6 weeks.  -NO chiropractic care for 6 weeks.     Incision Care    Keep your incision(s) clean and dry.  It is ok to shower unless otherwise noted.  Do not soak in a bath tub, hot tub, pool or lake for 6 weeks.  Do not use creams or lotions on incision until it is fully healed.  If your surgeon used Dermabond (skin glue), this will fall off gradually, do not pick at the glue.  If you notice redness, swelling, drainage, foul odor, or sutures sticking up through your incision, please call the Cardiothoracic Surgery clinic immediately.  [phone]     Suture/Staple Removal    You will need your sutures removed at follow up appointment.     Report These Signs and Symptoms    * Fever greater than 101 degrees   * Excessive redness of your incisions   * Colored drainage (yellow or green), bloody drainage, or odorous drainage from your incisions. It is normal to have a small amount of clear, pink, or brown-tinged drainage. If you have any concerns, call the surgeon's office.   * Uncontrolled pain   * Increased difficulty swallowing, uncontrolled nausea, vomiting, or diarrhea   * Vomiting blood or bloody stools   * Chest pain or back pain that is new and  not controlled with pain medication   * Increased difficulty breathing     [facility] Return Appointment    You will have a chest xray at 11:30a, with an appointment to follow.    Where do I go for my x-ray?  Enter the main hospital entrance  Go past the information desk, past the escalators, to the six-pack of elevators.  (If you reach the cafeteria you have gone too far!)  Take the elevator to the 2nd floor.  Follow the sign for General Radiology - you will go down a short hallway.  Once your x-ray is complete, return to the main floor and check-in for your appointment at the Cardiovascular Medicine and Cardiovascular and Thoracic Surgery office located by the main entrance.     [place] Provider [name] [[id number]]    [facility]    Appointment date: 07/14/2023    Appointment time: 11:30 AM      Over the counter medications    If you have been prescribed Over-the-Counter medications.  These medications are very important for you to pick-up at the pharmacy and take them as directed.     Questions About Your Stay    For questions or concerns regarding your hospital stay. Call [phone]     Discharging attending physician: [name] [[id number]]       Current Discharge Medication List         START taking these medications    Details   acetaminophen SR (TYLENOL ARTHRITIS PAIN) 650 mg tablet Take one tablet by mouth every 8 hours as needed for Pain. Indications: pain    PRESCRIPTION TYPE:  OTC      HYDROcodone/acetaminophen (NORCO) 5/325 mg tablet Take one tablet by mouth every 6 hours as needed for Pain. Indications: pain  Qty: 20 tablet, Refills: 0    PRESCRIPTION TYPE:  Normal      polyethylene glycol 3350 (MIRALAX) 17 g packet Take one packet by mouth daily. Indications: constipation  Refills: 0    PRESCRIPTION TYPE:  OTC senna (SENOKOT) 8.6 mg tablet Take one tablet by mouth twice daily. Indications: constipation    PRESCRIPTION TYPE:  OTC            CONTINUE these medications which have NOT CHANGED    Details   aspirin EC 81 mg tablet Take one tablet by mouth daily. Take with food.    PRESCRIPTION TYPE:  Historical Med              Scheduled appointments:      Jul 14, 2023 12:00 PM  Postoperative visit with [name], MD  Cardiothoracic Surgery: [facility] ([facility]) [address]  [place], [address]  [place] [address]  [phone]              Signed:  [place] Cash, DO  06/26/2023      cc:  Primary Care Physician:  Erskine Emery     Referring physicians:  Boykin Nearing, Maykol*   Additional provider(s):

## 2023-06-16 ENCOUNTER — Encounter: Admit: 2023-06-16 | Discharge: 2023-06-16 | Payer: MEDICARE

## 2023-06-16 ENCOUNTER — Ambulatory Visit: Admit: 2023-06-16 | Discharge: 2023-06-16 | Payer: MEDICARE

## 2023-06-16 DIAGNOSIS — I25118 Atherosclerotic heart disease of native coronary artery with other forms of angina pectoris: Secondary | ICD-10-CM

## 2023-06-16 DIAGNOSIS — E785 Hyperlipidemia, unspecified: Secondary | ICD-10-CM

## 2023-06-16 DIAGNOSIS — I499 Cardiac arrhythmia, unspecified: Secondary | ICD-10-CM

## 2023-06-16 DIAGNOSIS — K219 Gastro-esophageal reflux disease without esophagitis: Secondary | ICD-10-CM

## 2023-06-16 DIAGNOSIS — I452 Bifascicular block: Secondary | ICD-10-CM

## 2023-06-16 DIAGNOSIS — I779 Disorder of arteries and arterioles, unspecified: Secondary | ICD-10-CM

## 2023-06-16 DIAGNOSIS — R911 Solitary pulmonary nodule: Secondary | ICD-10-CM

## 2023-06-16 DIAGNOSIS — I2510 Atherosclerotic heart disease of native coronary artery without angina pectoris: Secondary | ICD-10-CM

## 2023-06-16 DIAGNOSIS — I6523 Occlusion and stenosis of bilateral carotid arteries: Secondary | ICD-10-CM

## 2023-06-16 DIAGNOSIS — C3432 Malignant neoplasm of lower lobe, left bronchus or lung: Secondary | ICD-10-CM

## 2023-06-16 DIAGNOSIS — E7800 Pure hypercholesterolemia, unspecified: Secondary | ICD-10-CM

## 2023-06-16 LAB — COMPREHENSIVE METABOLIC PANEL
ALBUMIN: 4.2 g/dL (ref 3.5–5.0)
ALK PHOSPHATASE: 56 U/L (ref 25–110)
ALT: 9 U/L (ref 7–56)
ANION GAP: 10 (ref 3–12)
AST: 17 U/L (ref 7–40)
BLD UREA NITROGEN: 12 mg/dL (ref 7–25)
CALCIUM: 8.6 mg/dL (ref 8.5–10.6)
CHLORIDE: 103 MMOL/L (ref 98–110)
CO2: 24 MMOL/L (ref 21–30)
CREATININE: 0.8 mg/dL (ref 0.4–1.24)
EGFR: >60 mL/min (ref >60)
GLUCOSE,PANEL: 78 mg/dL (ref 70–100)
POTASSIUM: 4.3 MMOL/L (ref 3.5–5.1)
SODIUM: 137 MMOL/L (ref 137–147)
TOTAL BILIRUBIN: 0.3 mg/dL (ref 0.2–1.3)
TOTAL PROTEIN: 7.1 g/dL (ref 6.0–8.0)

## 2023-06-16 LAB — CBC: WBC COUNT: 6.8 10*3/uL (ref 4.5–11.0)

## 2023-06-16 MED ORDER — EUCALYPTUS-MENTHOL MM LOZG
1 | Freq: Once | ORAL | 0 refills | Status: AC | PRN
Start: 2023-06-16 — End: ?

## 2023-06-16 MED ORDER — RP DX TC-99M TETROFOSMIN MCI
6 | Freq: Once | INTRAVENOUS | 0 refills | Status: CP
Start: 2023-06-16 — End: ?
  Administered 2023-06-16: 13:00:00 6.2 via INTRAVENOUS

## 2023-06-16 MED ORDER — ALBUTEROL SULFATE 90 MCG/ACTUATION IN HFAA
2 | RESPIRATORY_TRACT | 0 refills | Status: AC | PRN
Start: 2023-06-16 — End: ?

## 2023-06-16 MED ORDER — AMINOPHYLLINE 25 MG/ML IV SOLN
50 mg | INTRAVENOUS | 0 refills | Status: AC | PRN
Start: 2023-06-16 — End: ?

## 2023-06-16 MED ORDER — RP DX TC-99M TETROFOSMIN MCI
18 | Freq: Once | INTRAVENOUS | 0 refills | Status: CP
Start: 2023-06-16 — End: ?
  Administered 2023-06-16: 15:00:00 19.6 via INTRAVENOUS

## 2023-06-16 MED ORDER — SODIUM CHLORIDE 0.9% IV SOLP
250 mL | INTRAVENOUS | 0 refills | Status: AC | PRN
Start: 2023-06-16 — End: ?

## 2023-06-16 MED ORDER — NITROGLYCERIN 0.4 MG SL SUBL
.4 mg | SUBLINGUAL | 0 refills | Status: AC | PRN
Start: 2023-06-16 — End: ?

## 2023-06-16 MED ORDER — REGADENOSON 0.4 MG/5 ML IV SYRG
.4 mg | Freq: Once | INTRAVENOUS | 0 refills | Status: CP
Start: 2023-06-16 — End: ?
  Administered 2023-06-16: 13:00:00 0.4 mg via INTRAVENOUS

## 2023-06-16 NOTE — Progress Notes
Pre-op education appt complete with pt, wife and son will be w/ pt on DOS.  Informed of current visitor policy.  Reviewed and provided written instructions.  Instructed pt to be NPO @ 2300 the noc before surgery; except for a sip of water to take any meds that the [facility] pharmacist or surgeon's office instructs pt to take on the morning of surgery.  As per written instructions, pt to take 2 preop showers w/ provided CHG 4% soap prior to coming to the hospital for surgery.  Instructed to check in at [facility] Admissions at TBD on DOS. Discussed process for pre-op, intra-op and post-op recovery.  Discussed post-op pain, importance of mobilization and s/s of infection after discharge.  Instructed pt to call the [facility] office if having any health changes prior to surgery.  Pt verbalized understanding of all instructions.  Denies skin or dental issues; denies sensitivity to metals. Pt currently still smoking about 10 cigarettes a day-will review with Dr Ignacia Palma. Pt instructed to take COVID test at home on 10/31. All questions answered to the pt's satisfaction. Escorted to [facility] Admitting for pre-registration and to [facility]. Preop labs to be collected by [facility].   AF

## 2023-06-21 ENCOUNTER — Encounter: Admit: 2023-06-21 | Discharge: 2023-06-21 | Payer: MEDICARE

## 2023-06-23 ENCOUNTER — Encounter: Admit: 2023-06-23 | Discharge: 2023-06-23 | Payer: MEDICARE

## 2023-06-23 MED ORDER — ACETAMINOPHEN 500 MG PO TAB
1000 mg | Freq: Once | ORAL | 0 refills | Status: CN
Start: 2023-06-23 — End: ?

## 2023-06-23 NOTE — Telephone Encounter
Spoke with [place], and informed of arrival time of 830 in AM for surgery. Instructed to arrive at [facility] Admission Desk. Pt reports negative Covid test today. No further questions.

## 2023-06-24 ENCOUNTER — Encounter: Admit: 2023-06-24 | Discharge: 2023-06-24 | Payer: MEDICARE

## 2023-06-24 ENCOUNTER — Inpatient Hospital Stay
Admit: 2023-06-24 | Discharge: 2023-06-25 | Disposition: A | Discharge status: Home / Self Care | Payer: MEDICARE | Attending: Student in an Organized Health Care Education/Training Program | Admitting: Student in an Organized Health Care Education/Training Program

## 2023-06-24 ENCOUNTER — Inpatient Hospital Stay: Admit: 2023-06-24 | Discharge: 2023-06-24 | Payer: MEDICARE

## 2023-06-24 ENCOUNTER — Inpatient Hospital Stay: Admit: 2023-06-24 | Discharge: 2023-06-25 | Payer: MEDICARE

## 2023-06-24 LAB — BASIC METABOLIC PANEL
ANION GAP: 12 pg (ref 3–12)
BLD UREA NITROGEN: 15 mg/dL (ref 7–25)
CALCIUM: 8.2 mg/dL — ABNORMAL LOW (ref 8.5–10.6)
CHLORIDE: 108 MMOL/L (ref 98–110)
CO2: 20 MMOL/L — ABNORMAL LOW (ref 21–30)
CREATININE: 0.8 mg/dL (ref 0.4–1.24)
EGFR: >60 mL/min (ref >60)
GLUCOSE,PANEL: 133 mg/dL — ABNORMAL HIGH (ref 70–100)
POTASSIUM: 4.8 MMOL/L (ref 3.5–5.1)

## 2023-06-24 LAB — TYPE & CROSSMATCH
ANTIBODY SCREEN: NEGATIVE
UNITS ORDERED: 0

## 2023-06-24 LAB — MAGNESIUM: MAGNESIUM: 1.9 mg/dL (ref 1.6–2.6)

## 2023-06-24 LAB — CBC
RBC COUNT: 4.4 M/UL (ref 4.4–5.5)
WBC COUNT: 11 10*3/uL (ref 4.5–11.0)

## 2023-06-24 MED ORDER — HYDROMORPHONE PCA 10 MG/50 ML SYR (STD CONC)(ADULT)(PREMADE)
INTRAVENOUS | 0 refills | Status: DC
Start: 2023-06-24 — End: 2023-06-25
  Administered 2023-06-24: 21:00:00 50.0000 mL via INTRAVENOUS

## 2023-06-24 MED ORDER — ONDANSETRON HCL (PF) 4 MG/2 ML IJ SOLN
4 mg | INTRAVENOUS | 0 refills | Status: DC | PRN
Start: 2023-06-24 — End: 2023-06-25

## 2023-06-24 MED ORDER — MEPERIDINE (PF) 25 MG/ML IJ SYRG
12.5 mg | Freq: Once | INTRAVENOUS | 0 refills | Status: CP
Start: 2023-06-24 — End: ?
  Administered 2023-06-24: 18:00:00 12.5 mg via INTRAVENOUS

## 2023-06-24 MED ORDER — OXYCODONE 5 MG PO TAB
5 mg | ORAL_TABLET | ORAL | 0 refills | Status: CN | PRN
Start: 2023-06-24 — End: ?

## 2023-06-24 MED ORDER — PANTOPRAZOLE 40 MG PO TBEC
40 mg | Freq: Every day | ORAL | 0 refills | Status: DC
Start: 2023-06-24 — End: 2023-06-25
  Administered 2023-06-25: 01:00:00 40 mg via ORAL

## 2023-06-24 MED ORDER — ENOXAPARIN 40 MG/0.4 ML SC SYRG
40 mg | Freq: Every day | SUBCUTANEOUS | 0 refills | Status: DC
Start: 2023-06-24 — End: 2023-06-25
  Administered 2023-06-25: 13:00:00 40 mg via SUBCUTANEOUS

## 2023-06-24 MED ORDER — DIPHENHYDRAMINE HCL 50 MG/ML IJ SOLN
25 mg | Freq: Once | INTRAVENOUS | 0 refills | Status: DC | PRN
Start: 2023-06-24 — End: 2023-06-24

## 2023-06-24 MED ORDER — FAMOTIDINE (PF) 20 MG/2 ML IV SOLN
20 mg | Freq: Once | INTRAVENOUS | 0 refills | Status: CP
Start: 2023-06-24 — End: ?
  Administered 2023-06-24: 15:00:00 20 mg via INTRAVENOUS

## 2023-06-24 MED ORDER — PHENYLEPHRINE 40 MCG/ML IN NS IV DRIP (STD CONC)
0-3 ug/kg/min | INTRAVENOUS | 0 refills | Status: DC
Start: 2023-06-24 — End: 2023-06-25
  Administered 2023-06-24 (×2): 0.1 ug/kg/min via INTRAVENOUS

## 2023-06-24 MED ORDER — BUPIVACAINE LIPOSOME (PF) 1.3 % (13.3 MG/ML) LINF SUSP
266 mg | Freq: Once | 0 refills | Status: CP
Start: 2023-06-24 — End: ?
  Administered 2023-06-24: 17:00:00 20 mL

## 2023-06-24 MED ORDER — IPRATROPIUM-ALBUTEROL 0.5 MG-3 MG(2.5 MG BASE)/3 ML IN NEBU
3 mL | Freq: Once | RESPIRATORY_TRACT | 0 refills | Status: CP
Start: 2023-06-24 — End: ?
  Administered 2023-06-24: 15:00:00 3 mL via RESPIRATORY_TRACT

## 2023-06-24 MED ORDER — METHOCARBAMOL 100 MG/ML IJ SOLN
1000 mg | Freq: Once | INTRAVENOUS | 0 refills | Status: DC
Start: 2023-06-24 — End: 2023-06-25

## 2023-06-24 MED ORDER — MAGNESIUM OXIDE 400 MG (241.3 MG MAGNESIUM) PO TAB
200-600 mg | ORAL | 0 refills | Status: DC | PRN
Start: 2023-06-24 — End: 2023-06-25
  Administered 2023-06-25: 10:00:00 200 mg via ORAL

## 2023-06-24 MED ORDER — BUPIVACAINE (PF) 0.5 % (5 MG/ML) IJ SOLN
0 refills | Status: DC
Start: 2023-06-24 — End: 2023-06-24

## 2023-06-24 MED ORDER — BISACODYL 10 MG RE SUPP
10 mg | Freq: Every day | RECTAL | 0 refills | Status: DC | PRN
Start: 2023-06-24 — End: 2023-06-25

## 2023-06-24 MED ORDER — SENNOSIDES-DOCUSATE SODIUM 8.6-50 MG PO TAB
2 | Freq: Two times a day (BID) | ORAL | 0 refills | Status: DC
Start: 2023-06-24 — End: 2023-06-25
  Administered 2023-06-25 (×2): 2 via ORAL

## 2023-06-24 MED ORDER — ASPIRIN 81 MG PO CHEW
81 mg | Freq: Every day | ORAL | 0 refills | Status: DC
Start: 2023-06-24 — End: 2023-06-25
  Administered 2023-06-24 – 2023-06-25 (×2): 81 mg via ORAL

## 2023-06-24 MED ORDER — FLUMAZENIL 0.1 MG/ML IV SOLN
.2 mg | INTRAVENOUS | 0 refills | Status: DC | PRN
Start: 2023-06-24 — End: 2023-06-24

## 2023-06-24 MED ORDER — HALOPERIDOL LACTATE 5 MG/ML IJ SOLN
1 mg | Freq: Once | INTRAVENOUS | 0 refills | Status: CP | PRN
Start: 2023-06-24 — End: ?
  Administered 2023-06-24: 18:00:00 1 mg via INTRAVENOUS

## 2023-06-24 MED ORDER — ACETAMINOPHEN 160 MG/5 ML PO SOLN
1000 mg | NASOGASTRIC | 0 refills | Status: DC
Start: 2023-06-24 — End: 2023-06-25

## 2023-06-24 MED ORDER — FENTANYL CITRATE (PF) 50 MCG/ML IJ SOLN
12.5-25 ug | INTRAVENOUS | 0 refills | Status: DC | PRN
Start: 2023-06-24 — End: 2023-06-24

## 2023-06-24 MED ORDER — ALBUMIN, HUMAN 5 % IV SOLP
250 mL | Freq: Once | INTRAVENOUS | 0 refills | Status: CP
Start: 2023-06-24 — End: ?

## 2023-06-24 MED ORDER — ACETAMINOPHEN 500 MG PO TAB
1000 mg | ORAL | 0 refills | Status: DC
Start: 2023-06-24 — End: 2023-06-25
  Administered 2023-06-25 (×2): 1000 mg via ORAL

## 2023-06-24 MED ORDER — CALCIUM CARBONATE 200 MG CALCIUM (500 MG) PO CHEW
500 mg | ORAL | 0 refills | Status: AC
Start: 2023-06-24 — End: ?
  Administered 2023-06-25: 01:00:00 500 mg via ORAL

## 2023-06-24 MED ORDER — ALUM-MAG HYDROXIDE-SIMETH 200-200-20 MG/5 ML PO SUSP
30 mL | ORAL | 0 refills | Status: DC | PRN
Start: 2023-06-24 — End: 2023-06-25

## 2023-06-24 MED ORDER — ONDANSETRON 4 MG PO TBDI
4 mg | ORAL | 0 refills | Status: DC | PRN
Start: 2023-06-24 — End: 2023-06-25

## 2023-06-24 MED ORDER — DOCUSATE SODIUM 50 MG/5 ML PO LIQD
100 mg | Freq: Two times a day (BID) | NASOGASTRIC | 0 refills | Status: DC
Start: 2023-06-24 — End: 2023-06-25

## 2023-06-24 MED ORDER — SENNOSIDES 8.8 MG/5 ML PO SYRP
17.6 mg | Freq: Two times a day (BID) | NASOGASTRIC | 0 refills | Status: DC
Start: 2023-06-24 — End: 2023-06-25

## 2023-06-24 MED ORDER — PANTOPRAZOLE 40 MG IV SOLR
40 mg | Freq: Every day | INTRAVENOUS | 0 refills | Status: DC
Start: 2023-06-24 — End: 2023-06-25

## 2023-06-24 MED ORDER — NALOXONE 0.4 MG/ML IJ SOLN
.04 mg | INTRAVENOUS | 0 refills | Status: DC | PRN
Start: 2023-06-24 — End: 2023-06-24

## 2023-06-24 MED ORDER — SODIUM CHLORIDE 0.9% IV SOLP
INTRAVENOUS | 0 refills | Status: AC
Start: 2023-06-24 — End: ?
  Administered 2023-06-24: 15:00:00 1000.0000 mL via INTRAVENOUS

## 2023-06-24 MED ORDER — LIDOCAINE (PF) 10 MG/ML (1 %) IJ SOLN
.2 mL | INTRAMUSCULAR | 0 refills | Status: DC | PRN
Start: 2023-06-24 — End: 2023-06-24
  Administered 2023-06-24: 15:00:00 0.2 mL via INTRAMUSCULAR

## 2023-06-24 MED ORDER — ATORVASTATIN 40 MG PO TAB
40 mg | Freq: Two times a day (BID) | ORAL | 0 refills | Status: DC
Start: 2023-06-24 — End: 2023-06-25
  Administered 2023-06-25 (×2): 40 mg via ORAL

## 2023-06-24 MED ORDER — SODIUM CHLORIDE 0.9 % IR SOLN
0 refills | Status: DC
Start: 2023-06-24 — End: 2023-06-24

## 2023-06-24 MED ORDER — METHOCARBAMOL 500 MG PO TAB
750 mg | Freq: Three times a day (TID) | ORAL | 0 refills | Status: DC
Start: 2023-06-24 — End: 2023-06-25
  Administered 2023-06-25 (×2): 750 mg via ORAL

## 2023-06-24 MED ORDER — POTASSIUM CHLORIDE 20 MEQ/15 ML PO LIQD
20-40 meq | NASOGASTRIC | 0 refills | Status: DC | PRN
Start: 2023-06-24 — End: 2023-06-25

## 2023-06-24 MED ORDER — NALOXONE 0.4 MG/ML IJ SOLN
.08 mg | INTRAVENOUS | 0 refills | Status: DC | PRN
Start: 2023-06-24 — End: 2023-06-25

## 2023-06-24 MED ORDER — HYDRALAZINE 20 MG/ML IJ SOLN
10 mg | INTRAVENOUS | 0 refills | Status: DC | PRN
Start: 2023-06-24 — End: 2023-06-25

## 2023-06-24 MED ORDER — POTASSIUM CHLORIDE 20 MEQ PO TBTQ
20-40 meq | ORAL | 0 refills | Status: DC | PRN
Start: 2023-06-24 — End: 2023-06-25
  Administered 2023-06-25: 10:00:00 20 meq via ORAL

## 2023-06-24 MED ORDER — PROCHLORPERAZINE 25 MG RE SUPP
25 mg | RECTAL | 0 refills | Status: DC | PRN
Start: 2023-06-24 — End: 2023-06-25

## 2023-06-24 MED ORDER — CEFAZOLIN INJ 1GM IVP
1 g | INTRAVENOUS | 0 refills | Status: CP
Start: 2023-06-24 — End: ?
  Administered 2023-06-24 – 2023-06-25 (×2): 1 g via INTRAVENOUS

## 2023-06-24 MED ORDER — POLYETHYLENE GLYCOL 3350 17 GRAM PO PWPK
17 g | Freq: Two times a day (BID) | ORAL | 0 refills | Status: DC
Start: 2023-06-24 — End: 2023-06-25

## 2023-06-24 MED ORDER — CALCIUM CARBONATE 200 MG CALCIUM (500 MG) PO CHEW
500 mg | ORAL | 0 refills | Status: DC | PRN
Start: 2023-06-24 — End: 2023-06-25

## 2023-06-24 MED ORDER — HEPARIN, PORCINE (PF) 5,000 UNIT/0.5 ML IJ SYRG
5000 [IU] | Freq: Once | SUBCUTANEOUS | 0 refills | Status: CP
Start: 2023-06-24 — End: ?
  Administered 2023-06-24: 15:00:00 5000 [IU] via SUBCUTANEOUS

## 2023-06-24 MED ADMIN — SODIUM CHLORIDE 0.9% IV SOLP [27838]: .4 ug/kg/min | INTRAVENOUS | @ 16:00:00 | Stop: 2023-06-24 | NDC 00338004902

## 2023-06-24 MED ADMIN — PROPOFOL 10 MG/ML IV EMUL [11150]: 80 ug/kg/min | INTRAVENOUS | @ 16:00:00 | Stop: 2023-06-24 | NDC 63323026965

## 2023-06-24 MED ADMIN — ALBUMIN, HUMAN 5 % IV SOLP [8982]: 250 mL | INTRAVENOUS | @ 18:00:00 | Stop: 2023-06-24 | NDC 68516521403

## 2023-06-24 MED ADMIN — PHENYLEPHRINE HCL 10 MG/ML IJ SOLN [6242]: .4 ug/kg/min | INTRAVENOUS | @ 16:00:00 | Stop: 2023-06-24 | NDC 70121157701

## 2023-06-24 NOTE — Progress Notes
RT Adult Assessment Note    NAME:Daniel Braun             MRN: [id number]             DOB:[birth year]          AGE: 77 y.o.  ADMISSION DATE: 06/24/2023             DAYS ADMITTED: LOS: 0 days    Additional Comments:  Impressions of the patient: alert no respiratory distress  Intervention(s)/outcome(s): Vest Q4H per CTS   Patient education that was completed: n/a  Recommendations to the care team: n/a    Vital Signs:  Pulse: 73  RR: 15 PER MINUTE  SpO2: 95 %  O2 Device: None (Room air)  Liter Flow:    O2%:      Breath Sounds:   Breath Sounds WDL: Exceptions to WDL  All Breath Sounds: Clear (Implies normal);Decreased  Respiratory Effort:   Respiratory WDL: Within Defined Limits  Comments:

## 2023-06-24 NOTE — H&P (View-Only)
History and Physical Update Note    Name:  Daniel Braun                          Medical Record Number:  [id number]     Allergies:  Patient has no known allergies.  Primary Care Physician: Erskine Emery  Verified    Lab/Radiology/Other Diagnostic Tests:  Hematology:    Lab Results   Component Value Date    HGB 14.7 06/16/2023    HCT 43.3 06/16/2023    PLTCT 241 06/16/2023    WBC 6.8 06/16/2023    MCV 94.4 06/16/2023    MCH 32.2 06/16/2023    MCHC 34.1 06/16/2023    MPV 7.9 06/16/2023    RDW 13.7 06/16/2023   , Coagulation:  No results found for: PT, PTT, INR, General Chemistry:    Lab Results   Component Value Date    NA 137 06/16/2023    K 4.3 06/16/2023    CL 103 06/16/2023    CO2 24 06/16/2023    GAP 10 06/16/2023    BUN 12 06/16/2023    CR 0.88 06/16/2023    GLU 78 06/16/2023    CA 8.6 06/16/2023    ALBUMIN 4.2 06/16/2023    TOTBILI 0.3 06/16/2023   , and Pertinent labs reviewed    Last Dose Beta Blockers/Anticoagulants:  Last dose of aspirin was 06/23/2023.      Point of Care Testing:  (Last 24 hours):          I have examined the patient, and there are no significant changes in their condition, from the previous H&P performed on 06/02/2023.    Herbert Deaner, APRN-NP  Pager

## 2023-06-24 NOTE — Progress Notes
THORACIC SURGERY DAILY PROGRESS NOTE    PROCEDURE:  Procedure(s) (LRB):  Left robotic assisted video thoracosopy with left lower lobe wedge resection (Left)  Left robotic assisted THORACOSCOPY WITH MEDIASTINAL AND REGIONAL LYMPHADENECTOMY (Left)  BLOCK, NERVE, INTERCOSTAL, 2 OR MORE (Left)  BRONCHOSCOPY DIAGNOSTIC FLEXIBLE  SURGEON:  Dr. 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)  Active Problems:    Coronary artery disease    Bilateral carotid artery stenosis    RBBB (right bundle branch block with left anterior fascicular block)    Shortness of breath    Lung nodule    Malignant neoplasm of lower lobe of left lung (HCC)    Stage 3 severe COPD by GOLD classification (HCC)    Chronic diastolic heart failure, NYHA class 1 (HCC)    Chest tube in place    Post-op pain    Hypotension    Hypocalcemia    Hypomagnesemia    Hyperglycemia       PLAN:  Neuro/Pain- Once recovered from anesthesia, transition to stepdown status on progressive care unit with PCA, robaxin, and tylenol.   PTA psych/pain meds? None.     CV - 1. CV - Rhythm: Normal Sinus Rhythm. Post op HR 70s-80s. Post op arterial BPs 110s-150s/30s-50s.  Maps 55-60.  Cuff BPs 60s-140s/40s-90s, Maps 51-112.  Hypotension in CVPP after receiving demerol and haldol.  1L albumin administered and required minimal phenyl support while in CVPP.     PTA cardiac meds: aspirin (restarted), atorvastatin (restarted per patient's reported home regimen).  Heart Failure? No.    Resp - Sats-  98% on RA. Wean oxygen as able.  CXR: Left pneumothorax, small. Cont aggressive pulm toilet, including VEST therapy.     Chest Tube(s) OUTPUT/24 HOURS AIR LEAK PRESENT CT Plan Today   Pleural 22 mL so far in CVPP No -20, may water seal this evening.      COPD: Yes-GOLD 3 (SEVERE):  FEV1 30-49 % predicted PFTs:   Lab Results   Component Value Date    FVCPRE 3.02 06/02/2023    FVCPREDPRE 80 06/02/2023    FEV1PRE 1.48 06/02/2023    FEV1PREDPRE 52 06/02/2023        Renal/FEN-Creatinine 0.88. Chronic kidney disease/stage: No.  Acute Kidney Injury?  No.  UOP adequate.  Foley removed intra-op. Needs to void.    Pending L/24hr, net pending L/24hr, overall pending L/admission.  Hyponatremia - No., Hypokalemia- No- monitor., Hypomagnesemia: Yes-Replace. Hypocalcemia? Yes- consider replacement with tums bid.Hyperglycemia? Yes.   PTA diuretics/electrolytes: Vit B 12 (held).    GI -  Diet: CLD, advance as tolerated to goal.  BM since surgery: No. Cont bowel regimen. Chronic Liver Disease? No. BMI: Body mass index is 24.48 kg/m?..      ID/Heme - WBC- 11.0, Afebrile. Hgb 14.0, acute blood loss anemia:No. Coagulopathy? No. Thrombocytopenia: No Lovenox SQ for DVT ppx.    Endo - No history of Diabetes or Thyroid disease.     Activity - Get pt up to chair and walk in hall TID    Disposition -    Initiate PCA. Monitor chest tube for air leak and output.        Herbert Deaner, APRN-NP  Cardiothoracic Surgery   Reach me on Voalte   at 06/24/2023 12:36 PM  After hours page [phone]    OBJECTIVE:  Vitals:    06/24/23 0845 06/24/23 0853 06/24/23 0900 06/24/23 1000   BP: (!) 144/71 (!) 147/80 (!) 150/72 135/88   BP Source: Arm, Left Upper Arm, Right Upper Arm, Right Upper Arm, Right Upper   Pulse: 71      Temp: 36.3 ?C (97.4 ?F)      SpO2: 98%      O2 Device: None (Room air)      Weight: 77.4 kg (170 lb 10.2 oz)      Height: 177.8 cm (5' 10)           Physical Exam:  General:drowsy.  MAE spontaneously.   Cardiovascular: RRR no rub or murmur  Respiratory: LS clear on right side, left side clear/decrease + expiratory wheezes.  No subQ air noted.   GI: soft, NT, +BS (hypoactive).   Extremities: No Edema  Incisions:  Left chest incision clean, dry, intact with dressing present.      Prophylaxis Review:  Lines:  Yes; Arterial Line; Indication:  Continuous BP monitoring; Location:  Radial  Antibiotic Usage:  No  VTE: Pharmacological prophylaxis; Enoxaparin and Mechanical prophylaxis; Sequential compression device  Urinary Catheter: No     LABS:   24-hour labs:    Results for orders placed or performed during the hospital encounter of 06/24/23 (from the past 24 hour(s))   TYPE & CROSSMATCH    Collection Time: 06/24/23  9:27 AM   Result Value Ref Range    Units Ordered 0     Crossmatch Expires 06/27/2023,2359     Record Check FOUND     ABO/RH(D) O POS     Antibody Screen NEG     Electronic Crossmatch YES    CBC    Collection Time: 06/24/23 12:45 PM   Result Value Ref Range    White Blood Cells 11.0 4.5 - 11.0 K/UL    RBC 4.43 4.4 - 5.5 M/UL    Hemoglobin 14.0 13.5 - 16.5 GM/DL    Hematocrit 69.6 40 - 50 %    MCV 94.8 80 - 100 FL    MCH 31.6 26 - 34 PG    MCHC 33.4 32.0 - 36.0 G/DL    RDW 29.5 11 - 15 %    Platelet Count 246 150 - 400 K/UL    MPV 7.8 7 - 11 FL   BASIC METABOLIC PANEL    Collection Time: 06/24/23 12:45 PM   Result Value Ref Range    Sodium 140 137 - 147 MMOL/L    Potassium 4.8 3.5 - 5.1 MMOL/L    Chloride 108 98 - 110 MMOL/L    CO2 20 (L) 21 - 30 MMOL/L    Anion Gap 12 3 - 12    Glucose 133 (H) 70 - 100 MG/DL    Blood Urea Nitrogen 15 7 - 25 MG/DL    Creatinine 2.84 0.4 - 1.24 MG/DL    Calcium 8.2 (L) 8.5 - 10.6 MG/DL    eGFR >13 >24 mL/min   MAGNESIUM    Collection Time: 06/24/23 12:45 PM   Result Value Ref Range    Magnesium 1.9 1.6 - 2.6 mg/dL    and Pertinent labs reviewed

## 2023-06-24 NOTE — Operative Report(Direct Entry)
OPERATIVE REPORT    Name: Daniel Braun is a 77 y.o. male     DOB: 1945/10/25             MRN#: [id number]    DATE OF OPERATION: 06/24/2023    Surgeons and Role:     * [name], MD - Primary     * Arbutus Leas, PA-C - Assisting     * Pamala Hurry, DO - Fellow        Preoperative Dx:   Left lower lobe squamous cell carcinoma     Postoperative Dx:  Left lower lobe squamous cell carcinoma    Procedure(s) (LRB):  Left robotic assisted video thoracosopy with left lower lobe wedge resection (Left)  Left robotic assisted THORACOSCOPY WITH MEDIASTINAL AND REGIONAL LYMPHADENECTOMY (Left)  BLOCK, NERVE, INTERCOSTAL, 2 OR MORE (Left)  BRONCHOSCOPY DIAGNOSTIC FLEXIBLE    Indications for operation: Daniel Braun is a 77 year old gentleman with biopsy-proven left lower lobe squamous cell carcinoma.  Patient understood that a sublobar resection will be offered given the patient's poor pulmonary function.  Patient was to the risk, benefits, and alternatives associate with the procedure.  Patient agreed to proceed with the procedure.      Description and Findings of Operative Procedure: Patient was identified in the preoperative waiting holding area.  Patient was taken operating placed in supine position.  General anesthesia was administered in the usual fashion with a double-lumen endotracheal tube.  At this time a timeout was performed that deemed the patient and procedure be correct.  Patient did receive perioperative antibiotics and DVT prophylaxis indicated.  We began the flex bronchoscopy demonstrating no endobronchial lesions.  The patient was then placed in the right lateral decubitus position with the left side up.  Patient was prepped and draped in the appropriate sterile fashion.     We began with an 8 mm camera port in the eighth interspace posterior axillary line.  Under direct visualization posterior 12 mm and 8 mm ports were placed.  Additionally a 12 mm and assist port were placed anteriorly.  The [employer] robot was docked.  Instrumentation was passed in the field.  We began by identifying the lesion in the superior segment of the left lower lobe.  Multiple fires of a parenchymal load stapler were used in order to perform a wedge resection.  Specimen was removed via the assist port in an Endo Catch bag.  Frozen section for pathology demonstrated no evidence of malignancy at the stapled margin.  A mediastinal lymph node dissection was performed obtaining levels 5, 8, 9, and 11.  Adequate hemostasis was achieved.  Instrumentation was removed from the field.  The [employer] X overlap was undocked.  Regional intercostal and serratus anterior nerve block were placed under direct visualization.  28 French chest was placed under direct visualization.  Lung was insufflated.  Incisions were closed multiple layers.  Dermabond was sterilely applied.  All instruments, needle, and sponge counts were correct at the end of the case.    Estimated Blood Loss:  No blood loss documented.     Specimen(s) Removed/Disposition:   ID Type Source Tests Collected by Time Destination   1 : left lower lobe wedge margin for frozen Tissue Lung LLL SURGICAL PATHOLOGY          [name], MD 06/24/2023 1126    2 : level 9 lymph node for permanent Tissue Lymph Node (Specify) SURGICAL PATHOLOGY  [name], MD 06/24/2023 1131    3 : level 8 lymph node for permanent Tissue Lymph Node (Specify) SURGICAL PATHOLOGY          [name], MD 06/24/2023 1132    4 : level 5 lymph node for permanent Tissue Lymph Node (Specify) SURGICAL PATHOLOGY          [name], MD 06/24/2023 1134    5 : level 11 lymph node for permanent Tissue Lymph Node (Specify) SURGICAL PATHOLOGY          [name], MD 06/24/2023 1136        Attestation: I was present for the entire procedure. and Both a resident and a APP participated in the surgical case, however due to the limited experience of the resident the APP was utilized as the primary Product manager. The physician assistant aided in positioning and draping of the patient, and placement of ports.  Once at the console they provided sterile exchange of instrumentation, retraction and retrieval of tissue during the operation, and closure of incisions upon completion of the procedure.       Complications:  None    Drains: Details on drains available on LDA report    Disposition:  PACU - stable          [name], MD  Pager

## 2023-06-24 NOTE — Unmapped
Brief Operative Note    Name: Daniel Braun is a 77 y.o. male     DOB: [birth year]             MRN#: [id number]  DATE OF OPERATION: 06/24/2023    Date:  06/24/2023        Preoperative Dx:   Left lower lobe squamous cell carcinoma    Postoperative Dx:  Left lower lobe squamous cell carcinoma    Procedure(s) (LRB):  Left robotic assisted video thoracosopy with left lower lobe wedge resection (Left)  Left robotic assisted THORACOSCOPY WITH MEDIASTINAL AND REGIONAL LYMPHADENECTOMY (Left)  BLOCK, NERVE, INTERCOSTAL, 2 OR MORE (Left)  BRONCHOSCOPY DIAGNOSTIC FLEXIBLE    Surgeons and Role:     * [name], MD - Primary     * Cathlean Cower, Joyce Copa, PA-C - Assisting     * Pamala Hurry, DO - Fellow      Findings:  Left lower lobe squamous cell carcinoma.  Margin negative for malignancy on frozen section.     Estimated Blood Loss: No blood loss documented.     Specimen(s) Removed/Disposition:   ID Type Source Tests Collected by Time Destination   1 : left lower lobe wedge margin for frozen Tissue Lung LLL SURGICAL PATHOLOGY          [name], MD 06/24/2023 1126    2 : level 9 lymph node for permanent Tissue Lymph Node (Specify) SURGICAL PATHOLOGY          [name], MD 06/24/2023 1131    3 : level 8 lymph node for permanent Tissue Lymph Node (Specify) SURGICAL PATHOLOGY          [name], MD 06/24/2023 1132    4 : level 5 lymph node for permanent Tissue Lymph Node (Specify) SURGICAL PATHOLOGY          [name], MD 06/24/2023 1134    5 : level 11 lymph node for permanent Tissue Lymph Node (Specify) SURGICAL PATHOLOGY          [name], MD 06/24/2023 1136        Complications:  None    Drains: Details on drains available on LDA report    Disposition:  PACU - stable    [name], MD  Pager

## 2023-06-25 ENCOUNTER — Encounter: Admit: 2023-06-25 | Discharge: 2023-06-25 | Payer: MEDICARE

## 2023-06-25 ENCOUNTER — Inpatient Hospital Stay: Admit: 2023-06-25 | Discharge: 2023-06-25 | Payer: MEDICARE

## 2023-06-25 DIAGNOSIS — J449 Chronic obstructive pulmonary disease, unspecified: Secondary | ICD-10-CM

## 2023-06-25 DIAGNOSIS — I959 Hypotension, unspecified: Secondary | ICD-10-CM

## 2023-06-25 DIAGNOSIS — R739 Hyperglycemia, unspecified: Secondary | ICD-10-CM

## 2023-06-25 DIAGNOSIS — I452 Bifascicular block: Secondary | ICD-10-CM

## 2023-06-25 DIAGNOSIS — E785 Hyperlipidemia, unspecified: Secondary | ICD-10-CM

## 2023-06-25 DIAGNOSIS — I5032 Chronic diastolic (congestive) heart failure: Secondary | ICD-10-CM

## 2023-06-25 DIAGNOSIS — I2510 Atherosclerotic heart disease of native coronary artery without angina pectoris: Secondary | ICD-10-CM

## 2023-06-25 DIAGNOSIS — I6523 Occlusion and stenosis of bilateral carotid arteries: Secondary | ICD-10-CM

## 2023-06-25 LAB — BASIC METABOLIC PANEL
ANION GAP: 10 pg — ABNORMAL LOW (ref 3–12)
BLD UREA NITROGEN: 14 mg/dL — ABNORMAL LOW (ref >60)
CALCIUM: 8.5 mg/dL (ref 8.5–10.6)
CREATININE: 0.8 mg/dL — ABNORMAL LOW (ref 0.4–1.24)
EGFR: >60 mL/min — ABNORMAL LOW (ref >60)
GLUCOSE,PANEL: 101 mg/dL — ABNORMAL HIGH (ref 70–100)

## 2023-06-25 LAB — CBC
HEMATOCRIT: 39 % — ABNORMAL LOW (ref 40–50)
MCV: 92 FL — ABNORMAL HIGH (ref 80–100)
RBC COUNT: 4.2 M/UL — ABNORMAL LOW (ref >60)
WBC COUNT: 8.3 10*3/uL — ABNORMAL LOW (ref 4.5–11.0)

## 2023-06-25 LAB — MAGNESIUM: MAGNESIUM: 1.9 mg/dL — ABNORMAL LOW (ref 1.6–2.6)

## 2023-06-25 MED ORDER — POLYETHYLENE GLYCOL 3350 17 GRAM PO PWPK
17 g | Freq: Every day | ORAL | 0 refills | 22.00000 days | Status: AC
Start: 2023-06-25 — End: ?

## 2023-06-25 MED ORDER — GABAPENTIN 100 MG PO CAP
200 mg | ORAL | 0 refills | Status: DC
Start: 2023-06-25 — End: 2023-06-25
  Administered 2023-06-25: 10:00:00 200 mg via ORAL

## 2023-06-25 MED ORDER — OXYCODONE 5 MG PO TAB
5-10 mg | ORAL | 0 refills | Status: DC | PRN
Start: 2023-06-25 — End: 2023-06-25
  Administered 2023-06-25: 10:00:00 10 mg via ORAL

## 2023-06-25 MED ORDER — SENNOSIDES 8.6 MG PO TAB
1 | Freq: Two times a day (BID) | ORAL | 0 refills | Status: AC
Start: 2023-06-25 — End: ?

## 2023-06-25 MED ORDER — ACETAMINOPHEN 650 MG PO TBER
650 mg | ORAL | 0 refills | Status: AC | PRN
Start: 2023-06-25 — End: ?

## 2023-06-25 MED ORDER — HYDROCODONE-ACETAMINOPHEN 5-325 MG PO TAB
1 | ORAL_TABLET | ORAL | 0 refills | 30.00000 days | Status: AC | PRN
Start: 2023-06-25 — End: ?
  Filled 2023-06-25: qty 20, 5d supply, fill #1

## 2023-06-25 NOTE — Progress Notes
HC4 END OF SHIFT NURSING NOTE  Acute events, nursing interventions, and communication with providers:   Assumed care @ 1615  A/Ox4. VSS. Tolerating RA.  No c/o pain from the patient.  Surgical incisions C/D/I.   CT placed on WS by A. Gossey, APRN, w/ 16 mls sanguinous output.   Fall bundle in place.   UOA, -BM this shift    Approx 1700: Pt arterial line SBP reading in 80-90s. Cuff reading in 120s. Pt denies lightheadedness. Doristine Mango, PA notified. Orders to go off cuff pressures and notify if pt develops symptoms.    Cardiac Rhythm: SR c 1AVB, BBB    Activity: Other       If Other, please explain:     Incentive Spirometer this shift: No  Max volume:     Intake and Output:        Intake/Output Summary (Last 24 hours) at 06/24/2023 1935  Last data filed at 06/24/2023 1923  Gross per 24 hour   Intake 900 ml   Output 363 ml   Net 537 ml          Last Bowel Movement Date:  (PTA)      Patient Education  Quality/Safety Education: Pain scale   Cardiac - Specific Education: Pt does not have an active cardiovascular diagnosis/condition  Other Patient education provided:   Learners: Patient  The following teaching method(s) were used: Verbal teaching  Response to learning: [employer] Understanding  Needs reinforcement on:     Patient Goal(s):  Patient will Use incentive spirometer 10xhr while awake  by discharge date         Patient will  Report breathing has improved  by discharge date   Other:    Restraints:  No     Restraint Goal: Patient will be free from injury while physically restrained.  See Docflowsheet for restraint documentation, interventions, education, etc.

## 2023-06-25 NOTE — Progress Notes
CTS Progress Note    Some pain this morning. On room air. Denies shortness of breath.     Vitals:    06/25/23 0411   BP:    Pulse: 69   Temp:    SpO2: 96%   O2 Device: None (Room air)   O2 Liter Flow:      Chest tube to WS, no leak, 110 output    A/P  CXR pending  Tentative plan for removal and discharge pending imaging    C Journee Kohen  CTS Fellow

## 2023-06-25 NOTE — Progress Notes
HC4 END OF SHIFT NURSING NOTE  Acute events, nursing interventions, and communication with providers:     No acute events. VSS. Minimal incisional pain managed w/ current regimen, see eMAR. D/C stepdown status + PCA this AM. L CT to WS w/ 73 ml of SS output overnight. New SQ air found at the L flank/abdomen surrounding CT @0300 . Pt status unchanged, no air leak present, no SOB. Notifed A. Clementeen Graham, APRN. No new orders.     Cardiac Rhythm: SR 1AV + BBB    Activity: Up to Chair      If Other, please explain:     Intake and Output:        Intake/Output Summary (Last 24 hours) at 06/25/2023 0731  Last data filed at 06/25/2023 0514  Gross per 24 hour   Intake 1141 ml   Output 1236 ml   Net -95 ml          Last Bowel Movement Date:  (PTA)      Patient Education  Quality/Safety Education: Fall risk, Pain scale , and Wound/incision care  Cardiac - Specific Education: Post cardiac procedure care: VATS  Other Patient education provided:   Learners: Patient, Family, and Significant Other  The following teaching method(s) were used: Verbal teaching  Response to learning: [employer] Understanding  Needs reinforcement on:     Patient Goal(s):  Patient will Verbalize readiness for discharge  by discharge date         Patient will  Pain will be </= 4 this shift  by the end of shift   Other:    Restraints:  No     Restraint Goal: Patient will be free from injury while physically restrained.  See Docflowsheet for restraint documentation, interventions, education, etc.

## 2023-06-26 ENCOUNTER — Encounter: Admit: 2023-06-26 | Discharge: 2023-06-26 | Payer: MEDICARE

## 2023-06-30 NOTE — Care Plan
[facility] CONSULTATION  This tobacco treatment counseling and education consult was completed by phone after the patient was discharged due to not being able to reach the patient while hospitalized.     ASSESSMENT/RECOMMENDATIONS  Patient was referred for [facility] consultation  Tobacco Use Treatment Practical Counseling was provided post-discharge by telephone (including recognizing danger situations, developing coping skills and providing basic information about quitting) .           MEDICATION RECOMMENDATIONS TO QUIT TOBACCO:  Discharge medication options: Provided education to patient about available medication and coverage Patient declined using smoking cessation medication at this time     Post discharge support referral: Declined    [employer] Educational Material: Declined  Materials will be mailed, emailed, or accessed by patient on our website [email or web address]       for this consult which was completed by phone after the patient discharged from the hospital.        History of Present Illness   Reports using 8-10 cigarettes per day.  Reports using tobacco after 60 minutes minutes of waking.  E-Cigarette or vape use: Never  Other tobacco use: Cigar, Pipe tobacco, and Smokeless tobacco former user quit more than 10 years ago  Years used: 59  Patient lives with other smokers or vapers  Plan about smoking after patient leaves the hospital: I do not know if I'm going to quit  Interest in quitting: Moderate   Set a quit date:  No    Contact Information    If I can be of further assistance, please call Elodia Florence [phone]          Information about [employer] services for patients and for clinicians can be found on our website  [email or web address]    For patients [email or web address]    For Providers  [email or web address]

## 2023-07-06 LAB — SURGICAL PATHOLOGY

## 2023-07-07 ENCOUNTER — Encounter: Admit: 2023-07-07 | Discharge: 2023-07-07 | Payer: MEDICARE

## 2023-07-12 NOTE — Progress Notes
Date of Service: 07/14/2023       Subjective:             Daniel Braun is a 77 y.o. male.      History of Present Illness  Daniel Braun presents to thoracic surgery clinic for postoperative care. He is a 77 y.o. 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  The patient underwent the procedure and tolerated it well (please see operative report for further details).  Post-operatively the patient was monitored on the Cardiothoracic Progressive Care Unit and remained there for the duration of their hospitalization.  Post-operative pain was managed with an IV PCA Dilaudid.  On POD #1 the patient was transitioned from step-down to telemetry status.  The patient's PCA was weaned down and off as the patient was transitioned to oral pain medication.  Their chest tube was watersealed in the post op unit.  They remained stable and increased their activity and oral intake.  Their chest tube was removed on POD#1.  A follow-up 2 view chest xray was stable.  The patient had normal bowel and bladder function.  The patient was stable to be discharged home on POD# 1.    Pending items needing follow up: Suture removal at clinic visit and pathology.    Condition at Discharge: Stable   Patient Disposition: Home       Today he states symptoms of slight ache from surgery, not taking pain meds. No fevers, chills, respiratory issues. Eating and drinking fine. Smoking 8-10 cigs per day.          CXR impression   Surgical changes of left lung wedge resection. No residual pneumothorax.     His Pathology report was reviewed with the patient.    Pathology impression:  ########################################################################  Final Diagnosis:     A. Lung, left lower lobe wedge, wedge resection:         Squamous cell carcinoma (1.2 cm).  See comment.  B. Fibroadipose tissue, level 9 lymph node, excision:         Benign fibroadipose tissue.       No lymph node present.      C. Lymph node (1), level 8 lymph node, excision:       Negative for tumor in 1 lymph node (0/1).        Granulomas with focal necrosis.  D. Lymph node (1), level 5 lymph node, excision:       Negative for tumor in 1 lymph node (0/1).      E. Lymph node (1), level 11 lymph node, excision:       Negative for tumor in 1 lymph node (0/1).               Comment:  CAP Version: Lung 4.2.0.1 CASE SUMMARY: (LUNG) Standard(s): AJCC-UICC 8      SPECIMEN      Synchronous Tumors (required if morphologically distinct unrelated  multiple primary tumors are present)   Not applicable      Procedure (select all that apply)   Wedge resection      Specimen Laterality   Left      TUMOR      Tumor Focality   Single focus  Tumor Site (select all that apply)   Lower lobe of lung      Tumor Size   Total Tumor Size (size of entire tumor)#        # The size of the entire tumor (total tumor size) applies to all  tumors and includes the invasive component and the nonmucinous lepidic  component of adenocarcinomas.   Greatest dimension in Centimeters (cm): 1.2 cm         Size of Invasive Component## (required only if invasive nonmucinous  adenocarcinomas with lepidic component is present)   ## The size of the invasive component applies only to invasive nonmucinous  adenocarcinomas with lepidic component. For all tumor types other than  invasive nonmucinous adenocarcinoma with lepidic component, invasive tumor  size equals total tumor size.   Not applicable      Histologic Type    Invasive squamous cell carcinoma, keratinizing      Histologic Patterns Present (may include percentages in 5-10% increments  totaling 100%)# (select all that apply)   # Applicable for invasive nonmucinous adenocarcinomas   Not applicable      +Histologic Grade   G1, well differentiated   G2, moderately differentiated      +Spread Through [employer] (STAS)   Not identified      Visceral Pleura Invasion   Not definitely identified      Direct Invasion of Adjacent Structures   Not applicable (no adjacent structures present)      Treatment Effect   No known presurgical therapy      Lymphovascular Invasion (select all that apply)   Not identified      MARGINS      Margin Status for Invasive Carcinoma   All margins negative for invasive carcinoma   Closest Margin(s) to Invasive Carcinoma (select all that apply)   Parenchymal:   +Distance from Invasive Carcinoma to Closest Margin   Specify in Centimeters (cm).   Exact distance: 0.6 cm     Margin Status for Non-Invasive Tumor (select all that apply)   All margins negative for non-invasive tumor      REGIONAL LYMPH NODES      Lymph Node(s) from Prior Procedures   Included   Prior Lymph Node Procedure(s) Included (describe and specify case ID):  [id number] (11RS, 11L, 7, 4R, 4L)     Regional Lymph Node Status   Regional lymph nodes present   All regional lymph nodes negative for tumor      Number of Lymph Nodes with Tumor   Exact number (specify): 0     Number of Lymph Nodes Examined   Exact number (specify): ?3     Nodal Site(s) Examined (select all that apply)   Right Nodal Stations Examined   4R: Lower paratracheal (FNA only)  11R: Interlobar (FNA only)  Central Nodal Stations Examined   7: Subcarinal (FNA only)  Left Nodal Stations Examined   4L: Lower paratracheal (FNA only)  5: Subaortic / aortopulmonary (AP) / AP window   8L: Para-esophageal   11L: Interlobar      DISTANT METASTASIS      Distant Site(s) Involved, if applicable (select all that apply)   ___ Not applicable      PATHOLOGIC STAGE CLASSIFICATION (pTNM, AJCC 8th Edition)   The suffix m (or a specific number) should only be used in the setting of  multifocal ground-glass / lepidic nodules that histologically present as  adenocarcinomas with prominent lepidic component or multifocal tumors of  same histologic  type that are too numerous for individual separate  synoptic report and that are not better classified as intrapulmonary  metastases (e.g. numerous carcinoid tumors). Multiple primary lung cancers  showing different histologic type or different morphology based on  comprehensive histologic subtyping are better staged as independent tumors  without m suffix.      TNM Descriptors (select all that apply)   ___ Not applicable      pT Category   ___ pT1b: Tumor greater than 1 cm but less than or equal to 2 cm in  greatest dimension      pN Category   ___ pN0: No regional lymph node metastasis      pM Category (required only if confirmed pathologically)   ___ Not applicable - pM cannot be determined from the submitted  specimen(s)      ADDITIONAL FINDINGS      +Additional Findings (select all that apply)   ___ Inflammation (specify type): Organizing pneumonia.            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   Fungal (GMS) and acid fast (AFB) stains are negative on C1 for fungal and  acid-fast organisms respectively.      A [place] Red on D1 is negative.     PD-L1:  Previously performed on prior biopsy ([id number])     Molecular testing (for non-small cell carcinomas that are pT2a and above  and/or with positive lymph nodes):  Not ordered     COMMENTS         Block for Biomarker Testing: A2         Attestation:  By this signature, I attest that I have personally formulated the final  interpretation expressed in this report and that the above diagnosis is  based upon my examination of the slides and/or other material indicated in  this report.     +++Electronically Signed Out By Harriette Ohara, MBBS on 07/02/2023+++ ksw/06/28/2023           ROS  Constitutional: Negative.   HENT: Negative.     Eyes: Negative.    Cardiovascular: Negative.    Respiratory: Negative.     Endocrine: Negative.    Hematologic/Lymphatic: Negative.    Skin: Negative.         Patient reports discomfort on the left side of chest wall      Musculoskeletal: Negative.    Gastrointestinal: Negative.    Genitourinary: Negative.    Neurological: Negative.    Psychiatric/Behavioral: Negative.     Allergic/Immunologic: Negative.            Objective:          acetaminophen SR (TYLENOL ARTHRITIS PAIN) 650 mg tablet Take one tablet by mouth every 8 hours as needed for Pain. Indications: pain    aspirin EC 81 mg tablet Take one tablet by mouth daily. Take with food.    HYDROcodone/acetaminophen (NORCO) 5/325 mg tablet Take one tablet by mouth every 6 hours as needed for Pain. Indications: pain (Patient not taking: Reported on 07/14/2023)    polyethylene glycol 3350 (MIRALAX) 17 g packet Take one packet by mouth daily. Indications: constipation (Patient not taking: Reported on 07/14/2023)    senna (SENOKOT) 8.6 mg tablet Take one tablet by mouth twice daily. Indications: constipation     Vitals:    07/14/23 1121   BP: 134/54  BP Source: Arm, Right Upper   Pulse: 74   SpO2: 97%   O2 Device: None (Room air)   PainSc: Zero   Weight: 80.3 kg (177 lb)   Height: 177.8 cm (5' 10)     Body mass index is 25.4 kg/m?Marland Kitchen     Physical Exam  Vitals reviewed.   Constitutional:       Appearance: He is well-developed.   HENT:      Head: Normocephalic.      Nose: Nose normal.   Eyes:      Conjunctiva/sclera: Conjunctivae normal.   Cardiovascular:      Rate and Rhythm: Normal rate and regular rhythm.      Heart sounds: Normal heart sounds.   Pulmonary:      Effort: Pulmonary effort is normal.      Breath sounds: Normal breath sounds.   Musculoskeletal:         General: Normal range of motion.      Cervical back: Neck supple.   Skin:     General: Skin is warm and dry.      Comments: Incision healing appropriately and well approximated without exudate, erythema, or swelling. Stitch removed with ease.        Neurological:      Mental Status: He is alert and oriented to person, place, and time.   Psychiatric:         Behavior: Behavior normal.         Thought Content: Thought content normal.         Judgment: Judgment normal.              Assessment and Plan:  1. Encounter for follow-up surveillance of lung cancer  CT CHEST W CONTRAST      2. Malignant neoplasm of lower lobe of left lung [facility])          Reviewed pathology, paper copy given. Post op restrictions reiterated. Quit smoking. RTC  in 6 months for surveillance.     Micheal Likens, APRN  Cardiothoracic Surgery               ATTESTATION     I personally interviewed and examined the patient.  I have reviewed the history, physical, impression and plan outlined by the Nurse Practitioner.     I had the pleasure of seeing Mr. Tjarks today in thoracic surgery clinic.  As you are aware he is a 77 year old gentleman who underwent a robotic assisted left lower lobe wedge resection for a T1b N0 squamous cell carcinoma on June 24, 2023.  Patient returns today for his postoperative visit.  Incisions of healed appropriately.  Pain is well-controlled.  Chest x-ray stable.  As such we will see the patient back in 6 months time with a repeat CT scan of his chest.  I appreciate the opportunity to participate in his care.  Please feel free to contact my office with any questions or concerns.    Staff name: Oleh Genin. Ignacia Palma, MD

## 2023-07-14 ENCOUNTER — Ambulatory Visit: Admit: 2023-07-14 | Discharge: 2023-07-15 | Payer: MEDICARE

## 2023-07-14 ENCOUNTER — Encounter: Admit: 2023-07-14 | Discharge: 2023-07-14 | Payer: MEDICARE

## 2023-07-14 DIAGNOSIS — C3432 Malignant neoplasm of lower lobe, left bronchus or lung: Secondary | ICD-10-CM

## 2023-07-14 DIAGNOSIS — Z08 Encounter for follow-up examination after completed treatment for malignant neoplasm: Secondary | ICD-10-CM

## 2023-07-14 NOTE — Patient Instructions
If you have any questions, please contact Mitzi Davenport or Maralyn Sago at [phone] (M-F 8a-4:30p)  After hours, you may call [phone] (Evenings/Weekends/Holidays)

## 2023-09-29 ENCOUNTER — Encounter: Admit: 2023-09-29 | Discharge: 2023-09-29 | Payer: MEDICARE

## 2023-09-30 ENCOUNTER — Ambulatory Visit: Admit: 2023-09-30 | Discharge: 2023-10-01 | Payer: MEDICARE

## 2023-09-30 ENCOUNTER — Encounter: Admit: 2023-09-30 | Discharge: 2023-09-30 | Payer: MEDICARE

## 2024-01-18 ENCOUNTER — Encounter: Admit: 2024-01-18 | Discharge: 2024-01-18 | Payer: MEDICARE

## 2024-01-31 ENCOUNTER — Encounter: Admit: 2024-01-31 | Discharge: 2024-01-31 | Payer: MEDICARE

## 2024-02-02 ENCOUNTER — Encounter: Admit: 2024-02-02 | Discharge: 2024-02-02 | Payer: MEDICARE

## 2024-02-02 ENCOUNTER — Ambulatory Visit: Admit: 2024-02-02 | Discharge: 2024-02-02 | Payer: MEDICARE

## 2024-03-21 ENCOUNTER — Encounter: Admit: 2024-03-21 | Discharge: 2024-03-21 | Payer: MEDICARE

## 2024-04-30 ENCOUNTER — Encounter: Admit: 2024-04-30 | Discharge: 2024-04-30 | Payer: MEDICARE

## 2024-07-04 ENCOUNTER — Encounter: Admit: 2024-07-04 | Discharge: 2024-07-04 | Payer: MEDICARE

## 2024-07-17 NOTE — Progress Notes [1]
 Date of Service: 07/24/2024       Subjective:             Daniel Braun is a 78 y.o. male.      History of Present Illness  Daniel Braun returns to thoracic surgery clinic for follow up. He is a 78 y.o. 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   Lung, left lower lobe wedge, wedge resection:  Squamous cell carcinoma (1.2 cm).  Path Staging- pT1bN0   PFT 52/87- pre surgery     Still smoking about 1/2 PPD of cigarettes. Breathing is good. Overall health is good. No hospitalizations. No interval medical changes or updates. No new neuro symptoms.        CT Chest Today   Stable changes of prior left lower lobe wedge resection for squamous cell   carcinoma.   Stable previously new small left upper lobe nodule. Recommend continued   imaging surveillance per clinical protocol.          ROS  Review of Systems   Constitutional: Negative.   HENT: Negative.     Eyes: Negative.    Cardiovascular: Negative.    Respiratory:  Positive for shortness of breath (after walking long distances).    Endocrine: Negative.    Hematologic/Lymphatic: Negative.    Skin: Negative.    Musculoskeletal: Negative.    Gastrointestinal: Negative.    Genitourinary: Negative.    Neurological: Negative.    Psychiatric/Behavioral: Negative.     Allergic/Immunologic: Negative.           Objective:          aspirin  EC 81 mg tablet Take one tablet by mouth daily. Take with food.    budesonide-formoterol HFA (SYMBICORT) 80-4.5 mcg/actuation aerosol inhaler Inhale two puffs by mouth into the lungs twice daily.    HYDROcodone /acetaminophen  (NORCO) 5/325 mg tablet Take one tablet by mouth every 6 hours as needed for Pain. Indications: pain     Vitals:    07/24/24 1245   BP: 98/58   BP Source: Arm, Left Upper   Pulse: 91   SpO2: 95%   PainSc: Zero   Weight: 77.6 kg (171 lb)   Height: 177.8 cm (5' 10)     Body mass index is 24.54 kg/m?SABRA     Physical Exam  Vitals reviewed.   Constitutional:       Appearance: He is well-developed.   HENT:      Head: Normocephalic.      Nose: Nose normal.   Eyes:      Conjunctiva/sclera: Conjunctivae normal.   Cardiovascular:      Rate and Rhythm: Normal rate and regular rhythm.      Heart sounds: Normal heart sounds.   Pulmonary:      Effort: Pulmonary effort is normal.      Breath sounds: Normal breath sounds.   Musculoskeletal:      Cervical back: Neck supple.   Neurological:      Mental Status: He is alert.              Assessment and Plan:  1. Encounter for follow-up surveillance of lung cancer        2. Malignant neoplasm of lower lobe of  left lung (CMS-HCC)        3. Tobacco abuse          S/p left robotic LLL wedge resection- pT1bN0 Squamous cell carcinoma (1.2 cm).    No evidence of recurrence, stable lung nodule. Re-eval in 6 months.     Michal Alexander, APRN  Cardiothoracic Surgery

## 2024-07-24 ENCOUNTER — Encounter: Admit: 2024-07-24 | Discharge: 2024-07-24 | Payer: MEDICARE

## 2024-07-24 ENCOUNTER — Ambulatory Visit: Admit: 2024-07-24 | Discharge: 2024-07-25 | Payer: MEDICARE

## 2024-07-24 ENCOUNTER — Ambulatory Visit: Admit: 2024-07-24 | Discharge: 2024-07-24 | Payer: MEDICARE

## 2024-07-24 VITALS — BP 98/58 | HR 91 | Ht 70.0 in | Wt 171.0 lb

## 2024-07-24 DIAGNOSIS — Z720 Tobacco use: Secondary | ICD-10-CM

## 2024-07-24 DIAGNOSIS — Z08 Encounter for follow-up examination after completed treatment for malignant neoplasm: Principal | ICD-10-CM

## 2024-07-24 DIAGNOSIS — C3432 Malignant neoplasm of lower lobe, left bronchus or lung: Secondary | ICD-10-CM

## 2024-07-24 DIAGNOSIS — R918 Other nonspecific abnormal finding of lung field: Secondary | ICD-10-CM
